# Patient Record
Sex: Male | Born: 1960 | Race: White | Hispanic: No | Marital: Married | Smoking: Never smoker
Health system: Southern US, Community
[De-identification: ages and names within clinical notes are randomized; demographics above are authoritative.]

## PROBLEM LIST (undated history)

## (undated) DIAGNOSIS — G8929 Other chronic pain: Secondary | ICD-10-CM

## (undated) DIAGNOSIS — C6290 Malignant neoplasm of unspecified testis, unspecified whether descended or undescended: Secondary | ICD-10-CM

## (undated) DIAGNOSIS — I10 Essential (primary) hypertension: Secondary | ICD-10-CM

## (undated) DIAGNOSIS — R079 Chest pain, unspecified: Secondary | ICD-10-CM

## (undated) DIAGNOSIS — Z9989 Dependence on other enabling machines and devices: Secondary | ICD-10-CM

## (undated) DIAGNOSIS — K56609 Unspecified intestinal obstruction, unspecified as to partial versus complete obstruction: Secondary | ICD-10-CM

## (undated) DIAGNOSIS — R51 Headache: Secondary | ICD-10-CM

## (undated) DIAGNOSIS — R519 Headache, unspecified: Secondary | ICD-10-CM

## (undated) DIAGNOSIS — G4733 Obstructive sleep apnea (adult) (pediatric): Secondary | ICD-10-CM

## (undated) DIAGNOSIS — Z8249 Family history of ischemic heart disease and other diseases of the circulatory system: Secondary | ICD-10-CM

## (undated) HISTORY — DX: Family history of ischemic heart disease and other diseases of the circulatory system: Z82.49

## (undated) HISTORY — DX: Unspecified intestinal obstruction, unspecified as to partial versus complete obstruction: K56.609

## (undated) HISTORY — PX: APPENDECTOMY: SHX54

## (undated) HISTORY — DX: Essential (primary) hypertension: I10

## (undated) HISTORY — DX: Malignant neoplasm of unspecified testis, unspecified whether descended or undescended: C62.90

## (undated) HISTORY — DX: Chest pain, unspecified: R07.9

---

## 1988-09-01 DIAGNOSIS — C6290 Malignant neoplasm of unspecified testis, unspecified whether descended or undescended: Secondary | ICD-10-CM

## 1988-09-01 DIAGNOSIS — K56609 Unspecified intestinal obstruction, unspecified as to partial versus complete obstruction: Secondary | ICD-10-CM

## 1988-09-01 HISTORY — DX: Unspecified intestinal obstruction, unspecified as to partial versus complete obstruction: K56.609

## 1988-09-01 HISTORY — PX: OTHER SURGICAL HISTORY: SHX169

## 1988-09-01 HISTORY — DX: Malignant neoplasm of unspecified testis, unspecified whether descended or undescended: C62.90

## 1999-11-08 ENCOUNTER — Emergency Department (HOSPITAL_COMMUNITY): Admission: EM | Admit: 1999-11-08 | Discharge: 1999-11-08 | Payer: Self-pay

## 1999-11-08 ENCOUNTER — Encounter: Payer: Self-pay | Admitting: Internal Medicine

## 2001-01-07 ENCOUNTER — Emergency Department (HOSPITAL_COMMUNITY): Admission: EM | Admit: 2001-01-07 | Discharge: 2001-01-07 | Payer: Self-pay | Admitting: Emergency Medicine

## 2002-06-12 ENCOUNTER — Emergency Department (HOSPITAL_COMMUNITY): Admission: EM | Admit: 2002-06-12 | Discharge: 2002-06-12 | Payer: Self-pay | Admitting: *Deleted

## 2002-06-12 ENCOUNTER — Encounter: Payer: Self-pay | Admitting: *Deleted

## 2004-10-22 ENCOUNTER — Ambulatory Visit (HOSPITAL_COMMUNITY): Admission: RE | Admit: 2004-10-22 | Discharge: 2004-10-22 | Payer: Self-pay | Admitting: Urology

## 2009-03-16 ENCOUNTER — Ambulatory Visit: Payer: Self-pay | Admitting: Vascular Surgery

## 2009-03-16 ENCOUNTER — Ambulatory Visit (HOSPITAL_COMMUNITY): Admission: RE | Admit: 2009-03-16 | Discharge: 2009-03-16 | Payer: Self-pay | Admitting: Urology

## 2009-03-16 ENCOUNTER — Encounter (INDEPENDENT_AMBULATORY_CARE_PROVIDER_SITE_OTHER): Payer: Self-pay | Admitting: Urology

## 2009-05-28 ENCOUNTER — Observation Stay (HOSPITAL_COMMUNITY): Admission: EM | Admit: 2009-05-28 | Discharge: 2009-05-28 | Payer: Self-pay | Admitting: Emergency Medicine

## 2009-05-28 ENCOUNTER — Encounter (INDEPENDENT_AMBULATORY_CARE_PROVIDER_SITE_OTHER): Payer: Self-pay | Admitting: Emergency Medicine

## 2009-05-28 ENCOUNTER — Ambulatory Visit: Payer: Self-pay | Admitting: Cardiology

## 2009-06-01 ENCOUNTER — Ambulatory Visit (HOSPITAL_COMMUNITY): Admission: RE | Admit: 2009-06-01 | Discharge: 2009-06-01 | Payer: Self-pay | Admitting: General Surgery

## 2010-12-06 LAB — DIFFERENTIAL
Basophils Absolute: 0 10*3/uL (ref 0.0–0.1)
Eosinophils Absolute: 0.3 10*3/uL (ref 0.0–0.7)
Eosinophils Relative: 5 % (ref 0–5)
Neutrophils Relative %: 61 % (ref 43–77)

## 2010-12-06 LAB — COMPREHENSIVE METABOLIC PANEL
ALT: 43 U/L (ref 0–53)
AST: 29 U/L (ref 0–37)
CO2: 22 mEq/L (ref 19–32)
Chloride: 104 mEq/L (ref 96–112)
Creatinine, Ser: 0.8 mg/dL (ref 0.4–1.5)
GFR calc Af Amer: >60 mL/min (ref >60)
GFR calc non Af Amer: >60 mL/min (ref >60)
Glucose, Bld: 106 mg/dL — ABNORMAL HIGH (ref 70–99)
Sodium: 136 mEq/L (ref 135–145)
Total Bilirubin: 0.9 mg/dL (ref 0.3–1.2)

## 2010-12-06 LAB — CBC
Hemoglobin: 15.8 g/dL (ref 13.0–17.0)
MCV: 89.7 fL (ref 78.0–100.0)
RBC: 5.09 MIL/uL (ref 4.22–5.81)
WBC: 5.2 10*3/uL (ref 4.0–10.5)

## 2010-12-06 LAB — POCT CARDIAC MARKERS
CKMB, poc: 1.2 ng/mL (ref 1.0–8.0)
Myoglobin, poc: 68.7 ng/mL (ref 12–200)
Troponin i, poc: <0.05 ng/mL (ref 0.00–0.09)

## 2010-12-06 LAB — LIPASE, BLOOD: Lipase: 33 U/L (ref 11–59)

## 2013-03-21 ENCOUNTER — Other Ambulatory Visit: Payer: Self-pay | Admitting: Orthopedic Surgery

## 2013-03-21 DIAGNOSIS — M25522 Pain in left elbow: Secondary | ICD-10-CM

## 2013-03-25 ENCOUNTER — Ambulatory Visit
Admission: RE | Admit: 2013-03-25 | Discharge: 2013-03-25 | Disposition: A | Discharge status: Home / Self Care | Payer: 59 | Source: Ambulatory Visit | Attending: Orthopedic Surgery | Admitting: Orthopedic Surgery

## 2013-03-25 DIAGNOSIS — M25522 Pain in left elbow: Secondary | ICD-10-CM

## 2014-02-27 ENCOUNTER — Encounter: Payer: Self-pay | Admitting: Cardiovascular Disease

## 2014-02-27 ENCOUNTER — Ambulatory Visit (INDEPENDENT_AMBULATORY_CARE_PROVIDER_SITE_OTHER): Payer: 59 | Admitting: Cardiovascular Disease

## 2014-02-27 VITALS — BP 112/76 | HR 98 | Ht 70.0 in | Wt 255.0 lb

## 2014-02-27 DIAGNOSIS — I10 Essential (primary) hypertension: Secondary | ICD-10-CM | POA: Insufficient documentation

## 2014-02-27 DIAGNOSIS — Z8249 Family history of ischemic heart disease and other diseases of the circulatory system: Secondary | ICD-10-CM

## 2014-02-27 DIAGNOSIS — R079 Chest pain, unspecified: Secondary | ICD-10-CM

## 2014-02-27 NOTE — Assessment & Plan Note (Signed)
He had recently increasing high blood pressure with addition of 3 antihypertensive medications by his primary care physician currently under better control.

## 2014-02-27 NOTE — Assessment & Plan Note (Signed)
Jimmy Riley is a 53 year old moderately overweight Caucasian male with a history of hypertension and a strong family history of heart disease with both parents with coronary bypass grafting in their 47s. He recently had a prolonged episode of chest pain with radiation to his left arm and diaphoresis one week ago. He said that her current symptoms. 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 Also going to get a lipid and liver profile

## 2014-02-27 NOTE — Patient Instructions (Signed)
  We will see you back in follow up after the test.   Dr Gwenlyn Found has ordered : 1. Exercise Myoview- this is a test that looks at the blood flow to your heart muscle.  It takes approximately 2 1/2 hours. Please follow instruction sheet, as given.  2. Your physician recommends that you return for a FASTING lipid profile: 1-2 weeks

## 2014-02-27 NOTE — Progress Notes (Signed)
02/27/2014 Jimmy Riley   January 30, 1961  [id number]  Primary Physician Florina Ou, MD Primary Cardiologist: Lorretta Harp MD Renae Gloss   HPI:  [name] is a delightful 53 year old moderately overweight married Caucasian male father of 4 children, grandfather and 4 grandchildren referred by Dr. Georgiana Spinner at [facility] clinic for cardiovascular evaluation. His cardiovascular cytopathologist notable for hypertension and a strong family history for heart disease with both parents having had bypass surgery in their early 68s. He has never had a heart attack or stroke. He said recently accelerated hypertension treated pharmacologically by his primary care physician. His past medical history otherwise is remarkable for remote testicular cancer cancer back in 1990. One week ago he developed prolonged episodes of sternal chest pain going to his left shoulder associated with diaphoresis lasting for 30 minutes. He has had no subsequent episodes.   Current Outpatient Prescriptions  Medication Sig Dispense Refill  . amLODipine (NORVASC) 10 MG tablet Take 10 mg by mouth daily.      Marland Kitchen aspirin 81 MG tablet Take 81 mg by mouth daily.      Marland Kitchen lisinopril-hydrochlorothiazide (PRINZIDE,ZESTORETIC) 20-12.5 MG per tablet Take 1 tablet by mouth 2 (two) times daily.       No current facility-administered medications for this visit.    Allergies  Allergen Reactions  . Tetracyclines & Related Itching    Swelling in mouth, lips    History   Social History  . Marital Status: Married    Spouse Name: N/A    Number of Children: N/A  . Years of Education: N/A   Occupational History  . Not on file.   Social History Main Topics  . Smoking status: Never Smoker   . Smokeless tobacco: Not on file  . Alcohol Use: Not on file  . Drug Use: Not on file  . Sexual Activity: Not on file   Other Topics Concern  . Not on file   Social History Narrative  . No narrative on file      Review of Systems: General: negative for chills, fever, night sweats or weight changes.  Cardiovascular: negative for chest pain, dyspnea on exertion, edema, orthopnea, palpitations, paroxysmal nocturnal dyspnea or shortness of breath Dermatological: negative for rash Respiratory: negative for cough or wheezing Urologic: negative for hematuria Abdominal: negative for nausea, vomiting, diarrhea, bright red blood per rectum, melena, or hematemesis Neurologic: negative for visual changes, syncope, or dizziness All other systems reviewed and are otherwise negative except as noted above.    Blood pressure 112/76, pulse 98, height 5\' 10"  (1.778 m), weight 255 lb (115.667 kg).  General appearance: alert and no distress Neck: no adenopathy, no carotid bruit, no JVD, supple, symmetrical, trachea midline and thyroid not enlarged, symmetric, no tenderness/mass/nodules Lungs: clear to auscultation bilaterally Heart: regular rate and rhythm, S1, S2 normal, no murmur, click, rub or gallop Extremities: extremities normal, atraumatic, no cyanosis or edema  EKG normal sinus rhythm at 98 without ST or T wave changes  ASSESSMENT AND PLAN:   Essential hypertension He had recently increasing high blood pressure with addition of 3 antihypertensive medications by his primary care physician currently under better control.  Chest pain [name] is a 53 year old moderately overweight Caucasian male with a history of hypertension and a strong family history of heart disease with both parents with coronary bypass grafting in their 68s. He recently had a prolonged episode of chest pain with radiation to his left arm and diaphoresis one week ago. He  said that her current symptoms. 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 Also going to get a lipid and liver profile      Lorretta Harp MD [facility],  [facility] 02/27/2014 5:02 PM

## 2014-03-06 ENCOUNTER — Telehealth (HOSPITAL_COMMUNITY): Payer: Self-pay | Admitting: *Deleted

## 2014-03-13 ENCOUNTER — Encounter: Payer: Self-pay | Admitting: Neurology

## 2014-03-13 ENCOUNTER — Encounter (HOSPITAL_COMMUNITY): Payer: Self-pay | Admitting: *Deleted

## 2014-03-13 ENCOUNTER — Ambulatory Visit (INDEPENDENT_AMBULATORY_CARE_PROVIDER_SITE_OTHER): Payer: 59 | Admitting: Neurology

## 2014-03-13 VITALS — BP 104/73 | HR 101 | Temp 98.5°F | Ht 70.0 in | Wt 250.0 lb

## 2014-03-13 DIAGNOSIS — R0989 Other specified symptoms and signs involving the circulatory and respiratory systems: Secondary | ICD-10-CM

## 2014-03-13 DIAGNOSIS — R519 Headache, unspecified: Secondary | ICD-10-CM

## 2014-03-13 DIAGNOSIS — R0683 Snoring: Secondary | ICD-10-CM

## 2014-03-13 DIAGNOSIS — G478 Other sleep disorders: Secondary | ICD-10-CM

## 2014-03-13 DIAGNOSIS — I10 Essential (primary) hypertension: Secondary | ICD-10-CM

## 2014-03-13 DIAGNOSIS — R51 Headache: Secondary | ICD-10-CM

## 2014-03-13 DIAGNOSIS — R0609 Other forms of dyspnea: Secondary | ICD-10-CM

## 2014-03-13 NOTE — Progress Notes (Signed)
Subjective:    Patient ID: Jimmy Riley is a 53 y.o. male.  HPI    Star Age, MD, PhD [facility] [address], [address] [place], [place] [address]    Dear Dr. Domingo Cocking,   I saw your patient, Jimmy Riley, upon your kind request in my neurologic clinic today for initial consultation of his recurrent headaches. The patient is unaccompanied today. As you know, Jimmy Riley is a 53 year old right-handed gentleman with a mediical history of hypertension, chest pain, and testicular cancer (in 1990 with SBO x 2), who has noted a severe primarily occipital headache for the past month or so. This is associated with a throbbing sensation and recurrent radiating neck pain on the R. Resting in a darkened room and taking Tylenol has helped some. He has no prior history of cluster headaches. He has no significant nausea, vomiting, sonophobia, photophobia, rhinorrhea, or excess lacrimation or nasal discharge. His headache started some 5 weeks ago and went on for a few weeks. He was noted to have an elevated BP, which has been treated first with lisinopril once daily, then twice daily, then with additional amplodipine. On 02/15/14 he was on a flight to [facility] for a training and had CP, with diaphoresis, which lasted some minutes. He has since then seen cardiology and has a stress test scheduled with Dr. Gwenlyn Found next week. He snores and is not sure if there are apneas. He has woken up with a HA. He does recall that when he sleeps in the recliner he often wakes up with a sense of strangling. He had a CTH some 2 weeks ago, which per his report was normal. He had some improved HAs since the BP came down. He has a family history of stroke in his father and PGF and heart disease on both sides of the family.  Never had TIA or stroke symptoms, denying sudden onset of one sided weakness, numbness, tingling, slurring of speech or droopy face, hearing loss, tinnitus, diplopia or  visual field cut or monocular loss of vision.  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 He does not sleep well.   His Past Medical History Is Significant For: Past Medical History  Diagnosis Date  . HTN (hypertension)   . Testicular cancer 1990  . Bowel obstruction 1990  . Chest pain   . Family history of heart disease     His Past Surgical History Is Significant For: Past Surgical History  Procedure Laterality Date  . Bowel surgery  1990    obstruction    His Family History Is Significant For: Family History  Problem Relation Age of Onset  . Diabetes Mother   . Hypertension Mother   . Cancer Mother   . Heart disease Mother     bypass in 27s  . Heart attack Maternal Grandfather     massive MI at age 69  . Hypertension Father   . Stroke Father   . Heart disease Father     bypass in 25s    His Social History Is Significant For: History   Social History  . Marital Status: Married    Spouse Name: N/A    Number of Children: N/A  . Years of Education: N/A   Social History Main Topics  . Smoking status: Never Smoker   . Smokeless tobacco: None  . Alcohol Use: None  . Drug Use: None  . Sexual Activity: None   Other Topics Concern  .  None   Social History Narrative  . None    His Allergies Are:  Allergies  Allergen Reactions  . Tetracyclines & Related Itching    Swelling in mouth, lips  :   His Current Medications Are:  Outpatient Encounter Prescriptions as of 03/13/2014  Medication Sig  . amLODipine (NORVASC) 10 MG tablet Take 10 mg by mouth daily.  Marland Kitchen aspirin 81 MG tablet Take 81 mg by mouth daily.  Marland Kitchen lisinopril-hydrochlorothiazide (PRINZIDE,ZESTORETIC) 20-12.5 MG per tablet Take 1 tablet by mouth 2 (two) times daily.  :  Review of Systems:  Out of a complete 14 point review of systems, all are reviewed and negative with the exception of these symptoms as listed below:  Review of Systems  Constitutional: Positive for fatigue.  Eyes:  Negative.   Respiratory: Positive for cough.   Cardiovascular: Negative.   Gastrointestinal: Negative.   Endocrine: Negative.   Genitourinary: Negative.   Musculoskeletal: Negative.   Allergic/Immunologic: Negative.   Neurological: Positive for weakness, numbness and headaches.  Hematological: Negative.   Psychiatric/Behavioral: Negative.     Objective:  Neurologic Exam  Physical Exam Physical Examination:   Filed Vitals:   03/13/14 1336  BP: 104/73  Pulse: 101  Temp:     General Examination: The patient is a very pleasant 53 y.o. male in no acute distress. He appears well-developed and well-nourished and well groomed. He is overweight.  HEENT: Normocephalic, atraumatic, pupils are equal, round and reactive to light and accommodation. Funduscopic exam is normal with sharp disc margins noted. Extraocular tracking is good without limitation to gaze excursion or nystagmus noted. Normal smooth pursuit is noted. Hearing is grossly intact. Tympanic membranes are clear bilaterally. Face is symmetric with normal facial animation and normal facial sensation. Speech is clear with no dysarthria noted. There is no hypophonia. There is no lip, neck/head, jaw or voice tremor. Neck is supple with full range of passive and active motion. There are no carotid bruits on auscultation. Oropharynx exam reveals: moderate mouth dryness, adequate dental hygiene and moderate airway crowding, due to redundant soft palate, larger uvula, and larger tongue. Mallampati is class II. Tongue protrudes centrally and palate elevates symmetrically. Tonsils are small in size. Neck size is 17 inches. He has a mild overbite.  Chest: Clear to auscultation without wheezing, rhonchi or crackles noted.  Heart: S1+S2+0, regular and normal without murmurs, rubs or gallops noted.   Abdomen: Soft, non-tender and non-distended with normal bowel sounds appreciated on auscultation.  Extremities: There is no pitting edema in the  distal lower extremities bilaterally. Pedal pulses are intact.  Skin: Warm and dry without trophic changes noted. There are no varicose veins.  Musculoskeletal: exam reveals no obvious joint deformities, tenderness or joint swelling or erythema.   Neurologically:  Mental status: The patient is awake, alert and oriented in all 4 spheres. His immediate and remote memory, attention, language skills and fund of knowledge are appropriate. There is no evidence of aphasia, agnosia, apraxia or anomia. Speech is clear with normal prosody and enunciation. Thought process is linear. Mood is normal and affect is normal.  Cranial nerves II - XII are as described above under HEENT exam. In addition: shoulder shrug is normal with equal shoulder height noted. Motor exam: Normal bulk, strength and tone is noted. There is no drift, tremor or rebound. Romberg is negative. Reflexes are 2+ throughout. Babinski: Toes are flexor bilaterally. Fine motor skills and coordination: intact with normal finger taps, normal hand movements, normal rapid alternating patting,  normal foot taps and normal foot agility.  Cerebellar testing: No dysmetria or intention tremor on finger to nose testing. Heel to shin is unremarkable bilaterally. There is no truncal or gait ataxia.  Sensory exam: intact to light touch, pinprick, vibration, temperature sense in the upper and lower extremities. He has some subjective numbness in the L sciatic area, not new.  Gait, station and balance: He stands easily. No veering to one side is noted. No leaning to one side is noted. Posture is age-appropriate and stance is narrow based. Gait shows normal stride length and normal pace. No problems turning are noted. He turns en bloc. Tandem walk is unremarkable. Intact toe and heel stance is noted.               Assessment and Plan:   In summary, Jimmy Riley is a very pleasant 53 y.o.-year old male with an underlying medical history of testicular cancer,  who recent started having recurrent severe headaches in the occipital area on the right with radiation to his neck. This was in conjunction with a relatively new diagnosis of significant hypertension which since then has been treated with 3 different medications and his headaches have improved. He had one episode of radiating chest pain with associated diaphoresis and is currently pursuing workup for this with cardiology. I do think his headaches are primarily linked to blood pressure elevation. I would like to proceed with a brain MRI for completion. Furthermore I would like to get the test results of his CT head. I also talked to him about doing a sleep study. Based on his tighter looking airway, his weight gain, the fact that he snores and his subjective feeling of waking up with a sense of choking, I think there may be underlying OSA which may be a contributor to recurrent headaches and elevations in blood pressure values. He would be agreeable to coming back for sleep study and also for his brain MRI. I would like to see him back afterwards. We mutually agreed that he does not currently require treatment symptomatically for his headaches. I reassured him that his neurological exam is nonfocal. We talked about headache triggers. We also talked about maintaining a healthy lifestyle and sleep apnea and its prognosis and treatment options and the link between untreated moderate to severe OSA and cardiovascular disease.   I answered all his questions today and the patient was in agreement with the above outlined plan. I would like to see the patient back after his tests are done, or sooner if the need arises and encouraged him to call with any interim questions, concerns, problems or updates.   Thank you very much for allowing me to participate in the care of this nice patient. If I can be of any further assistance to you please do not hesitate to call me at [phone].  Sincerely,   Star Age, MD,  PhD

## 2014-03-13 NOTE — Patient Instructions (Signed)
I think you have had headaches primarily due to your higher BP.  Let's do a brain MRI and sleep study.   Based on your symptoms and your exam I believe you are at risk for obstructive sleep apnea or OSA, and I think we should proceed with a sleep study to determine whether you do or do not have OSA and how severe it is. If you have more than mild OSA, I want you to consider treatment with CPAP. Please remember, the risks and ramifications of moderate to severe obstructive sleep apnea or OSA are: Cardiovascular disease, including congestive heart failure, stroke, difficult to control hypertension, arrhythmias, and even type 2 diabetes has been linked to untreated OSA. Sleep apnea causes disruption of sleep and sleep deprivation in most cases, which, in turn, can cause recurrent headaches, problems with memory, mood, concentration, focus, and vigilance. Most people with untreated sleep apnea report excessive daytime sleepiness, which can affect their ability to drive. Please do not drive if you feel sleepy.  I will see you back after your sleep study to go over the test results and where to go from there. We will call you after your sleep study and to set up an appointment at the time.   Please remember, common headache triggers are: sleep deprivation, dehydration, overheating, stress, hypoglycemia or skipping meals and blood sugar fluctuations, excessive pain medications or excessive alcohol use or caffeine withdrawal. Some people have food triggers such as aged cheese, orange juice or chocolate, especially dark chocolate, or MSG (monosodium glutamate). Try to avoid these headache triggers as much possible. It may be helpful to keep a headache diary to figure out what makes your headaches worse or brings them on and what alleviates them. Some people report headache onset after exercise but studies have shown that regular exercise may actually prevent headaches from coming. If you have exercise-induced  headaches, please make sure that you drink plenty of fluid before and after exercising and that you do not over do it and do not overheat.

## 2014-03-16 ENCOUNTER — Telehealth (HOSPITAL_COMMUNITY): Payer: Self-pay

## 2014-03-16 ENCOUNTER — Other Ambulatory Visit: Payer: 59

## 2014-03-16 NOTE — Telephone Encounter (Signed)
Encounter complete. 

## 2014-03-17 ENCOUNTER — Telehealth (HOSPITAL_COMMUNITY): Payer: Self-pay

## 2014-03-17 NOTE — Telephone Encounter (Signed)
Encounter complete. 

## 2014-03-21 ENCOUNTER — Encounter (HOSPITAL_COMMUNITY): Payer: 59

## 2014-03-21 ENCOUNTER — Telehealth (HOSPITAL_COMMUNITY): Payer: Self-pay

## 2014-03-21 NOTE — Telephone Encounter (Signed)
Encounter complete. 

## 2014-03-22 ENCOUNTER — Telehealth (HOSPITAL_COMMUNITY): Payer: Self-pay

## 2014-03-22 ENCOUNTER — Ambulatory Visit (HOSPITAL_COMMUNITY)
Admission: RE | Admit: 2014-03-22 | Discharge: 2014-03-22 | Disposition: A | Discharge status: Home / Self Care | Payer: 59 | Source: Ambulatory Visit | Attending: Cardiovascular Disease | Admitting: Cardiovascular Disease

## 2014-03-22 DIAGNOSIS — R079 Chest pain, unspecified: Secondary | ICD-10-CM | POA: Insufficient documentation

## 2014-03-22 DIAGNOSIS — Z8249 Family history of ischemic heart disease and other diseases of the circulatory system: Secondary | ICD-10-CM | POA: Insufficient documentation

## 2014-03-22 MED ORDER — TECHNETIUM TC 99M SESTAMIBI GENERIC - CARDIOLITE
10.4000 | Freq: Once | INTRAVENOUS | Status: AC | PRN
  Administered 2014-03-22: 10 via INTRAVENOUS

## 2014-03-22 MED ORDER — TECHNETIUM TC 99M SESTAMIBI GENERIC - CARDIOLITE
30.4000 | Freq: Once | INTRAVENOUS | Status: AC | PRN
  Administered 2014-03-22: 30 via INTRAVENOUS

## 2014-03-22 NOTE — Procedures (Addendum)
[facility] [address] [place] [address] [phone]  Cardiology Nuclear Med Study  Jimmy Riley is a 53 y.o. male     MRN : [id number]     DOB: September 15, 1960  Procedure Date: 03/22/2014  Nuclear Med Background Indication for Stress Test:  Evaluation for Ischemia History:  No prior respiratory or cardiac history reported;No prior NUC MPI for comparison; Cardiac Risk Factors: Family History - CAD, Hypertension and Overweight  Symptoms:  Chest Pain, Diaphoresis, DOE, Fatigue, Light-Headedness and weakness;cough;left arm pain   Nuclear Pre-Procedure Caffeine/Decaff Intake:  1:00am NPO After: 11am   IV Site: R Forearm  IV 0.9% NS with Angio Cath:  22g  Chest Size (in):  50"  IV Started by: Rolene Course, RN  Height: 5\' 10"  (1.778 m)  Cup Size: n/a  BMI:  Body mass index is 36.59 kg/(m^2). Weight:  255 lb (115.667 kg)   Tech Comments:  n/a    Nuclear Med Study 1 or 2 day study: 1 day  Stress Test Type:  Stress  Order Authorizing Provider:  Quay Burow, MD   Resting Radionuclide: Technetium 52m Sestamibi  Resting Radionuclide Dose: 10.4 mCi   Stress Radionuclide:  Technetium 19m Sestamibi  Stress Radionuclide Dose: 30.4 mCi           Stress Protocol Rest HR: 93 Stress HR: 160  Rest BP: 110/76 Stress BP: 147/73  Exercise Time (min): 9:32 METS: 10.90   Predicted Max HR: 168 bpm % Max HR: 95.24 bpm Rate Pressure Product: 23520  Dose of Adenosine (mg):  n/a Dose of Lexiscan: n/a mg  Dose of Atropine (mg): n/a Dose of Dobutamine: n/a mcg/kg/min (at max HR)  Stress Test Technologist: Mellody Memos, CCT Nuclear Technologist: Imagene Riches, CNMT   Rest Procedure:  Myocardial perfusion imaging was performed at rest 45 minutes following the intravenous administration of Technetium 63m Sestamibi. Stress Procedure:  The patient performed treadmill exercise using a Bruce  Protocol for 9 minutes 32 seconds. The patient  stopped due to generalized fatigue. Patient denied any chest pain.  There were no significant ST-T wave changes.  Technetium 85m Sestamibi was injected IV at peak exercise and myocardial perfusion imaging was performed after a brief delay.  Transient Ischemic Dilatation (Normal <1.22):  1.03  QGS EDV:  66 ml QGS ESV:  24 ml LV Ejection Fraction: 64%        Rest ECG: NSR - Normal EKG  Stress ECG: No significant change from baseline ECG  QPS Raw Data Images:  Normal; no motion artifact; normal heart/lung ratio. Stress Images:  Normal homogeneous uptake in all areas of the myocardium. Rest Images:  Normal homogeneous uptake in all areas of the myocardium. Subtraction (SDS):  No evidence of ischemia.  Impression Exercise Capacity:  Excellent exercise capacity. BP Response:  Normal blood pressure response. Clinical Symptoms:  No significant symptoms noted. ECG Impression:  No significant ST segment change suggestive of ischemia. Comparison with Prior Nuclear Study: No images to compare  Overall Impression:  Normal stress nuclear study.  LV Wall Motion:  NL LV Function; NL Wall Motion   Lorretta Harp, MD  03/22/2014 5:10 PM

## 2014-03-22 NOTE — Telephone Encounter (Signed)
Encounter complete. 

## 2014-03-23 ENCOUNTER — Telehealth: Payer: Self-pay | Admitting: *Deleted

## 2014-03-23 NOTE — Telephone Encounter (Signed)
Spoke to patient. Result given . Verbalized understanding  

## 2014-03-23 NOTE — Telephone Encounter (Signed)
Message copied by Raiford Simmonds on Thu Mar 23, 2014  6:13 PM ------      Message from: Lorretta Harp      Created: Thu Mar 23, 2014  1:50 PM       Call and tell normal ------

## 2014-04-25 ENCOUNTER — Ambulatory Visit (INDEPENDENT_AMBULATORY_CARE_PROVIDER_SITE_OTHER): Payer: 59 | Admitting: Neurology

## 2014-04-25 ENCOUNTER — Encounter: Payer: Self-pay | Admitting: Neurology

## 2014-04-25 DIAGNOSIS — G4761 Periodic limb movement disorder: Secondary | ICD-10-CM

## 2014-04-25 DIAGNOSIS — R51 Headache: Principal | ICD-10-CM

## 2014-04-25 DIAGNOSIS — R519 Headache, unspecified: Secondary | ICD-10-CM

## 2014-04-25 DIAGNOSIS — G4733 Obstructive sleep apnea (adult) (pediatric): Secondary | ICD-10-CM

## 2014-04-25 DIAGNOSIS — R0683 Snoring: Secondary | ICD-10-CM

## 2014-04-25 DIAGNOSIS — I10 Essential (primary) hypertension: Secondary | ICD-10-CM

## 2014-04-25 DIAGNOSIS — G478 Other sleep disorders: Secondary | ICD-10-CM

## 2014-05-10 ENCOUNTER — Telehealth: Payer: Self-pay | Admitting: Neurology

## 2014-05-10 DIAGNOSIS — G4733 Obstructive sleep apnea (adult) (pediatric): Secondary | ICD-10-CM

## 2014-05-10 NOTE — Telephone Encounter (Signed)
Please call and notify patient that the recent sleep study confirmed the diagnosis of OSA. He did well with CPAP during the study with significant improvement of the respiratory events. Therefore, I would like start the patient on CPAP at home. I placed the order in the chart.   Arrange for CPAP set up at home through a DME company of patient's choice and fax/route report to PCP and referring MD (if other than PCP).   The patient will also need a follow up appointment with me in 6-8 weeks post set up that has to be scheduled; help the patient schedule this (in a follow-up slot).   Please re-enforce the importance of compliance with treatment and the need for us to monitor compliance data.   Once you have spoken to the patient and scheduled the return appointment, you may close this encounter, thanks,   Weyman Bogdon, MD, PhD [facility]    

## 2014-05-15 NOTE — Telephone Encounter (Signed)
Called and provided results of patient's split night study performed on 04/25/2014.  Patient was informed that the use of CPAP had been ordered and he would be set up with a DME company in this case [facility].  The patient was informed that a copy of the test would be mailed to him and a copy would be provided to the referring physician; Dr. Richrd Prime.

## 2014-05-16 ENCOUNTER — Encounter: Payer: Self-pay | Admitting: Neurology

## 2014-08-01 ENCOUNTER — Encounter: Payer: Self-pay | Admitting: Neurology

## 2014-10-02 NOTE — Progress Notes (Signed)
Quick Note:  I reviewed the patient's CPAP compliance data from 06/30/2014 through 07/29/2014 which is a total of 30 days but he did not use his machine, except for 2 days with an average usage of one hour and 4 minutes for those 2 days. He is not using his machine and is not compliant. Please contact his DME company for further information about what his compliance obstacles may be and also get in touch with patient regarding his noncompliance. Please encourage him to make a follow-up appointment with me.  Star Age, MD, PhD [facility]  ______

## 2014-11-24 ENCOUNTER — Encounter (HOSPITAL_COMMUNITY): Payer: Self-pay | Admitting: Emergency Medicine

## 2014-11-24 ENCOUNTER — Encounter (HOSPITAL_COMMUNITY)
Admission: EM | Disposition: A | Discharge status: Home / Self Care | Payer: Self-pay | Source: Home / Self Care | Attending: Internal Medicine

## 2014-11-24 ENCOUNTER — Emergency Department (HOSPITAL_COMMUNITY): Payer: 59

## 2014-11-24 ENCOUNTER — Inpatient Hospital Stay (HOSPITAL_COMMUNITY)
Admission: EM | Admit: 2014-11-24 | Discharge: 2014-11-25 | DRG: 287 | Disposition: A | Discharge status: Home / Self Care | Payer: 59 | Attending: Internal Medicine | Admitting: Internal Medicine

## 2014-11-24 DIAGNOSIS — I10 Essential (primary) hypertension: Secondary | ICD-10-CM | POA: Diagnosis present

## 2014-11-24 DIAGNOSIS — I200 Unstable angina: Secondary | ICD-10-CM | POA: Diagnosis present

## 2014-11-24 DIAGNOSIS — Z888 Allergy status to other drugs, medicaments and biological substances status: Secondary | ICD-10-CM

## 2014-11-24 DIAGNOSIS — G4733 Obstructive sleep apnea (adult) (pediatric): Secondary | ICD-10-CM | POA: Diagnosis present

## 2014-11-24 DIAGNOSIS — Z8547 Personal history of malignant neoplasm of testis: Secondary | ICD-10-CM | POA: Diagnosis not present

## 2014-11-24 DIAGNOSIS — Q245 Malformation of coronary vessels: Secondary | ICD-10-CM | POA: Diagnosis not present

## 2014-11-24 DIAGNOSIS — R51 Headache: Secondary | ICD-10-CM | POA: Diagnosis present

## 2014-11-24 DIAGNOSIS — Z6835 Body mass index (BMI) 35.0-35.9, adult: Secondary | ICD-10-CM | POA: Diagnosis not present

## 2014-11-24 DIAGNOSIS — R079 Chest pain, unspecified: Secondary | ICD-10-CM | POA: Diagnosis present

## 2014-11-24 DIAGNOSIS — Z7982 Long term (current) use of aspirin: Secondary | ICD-10-CM

## 2014-11-24 DIAGNOSIS — R0902 Hypoxemia: Secondary | ICD-10-CM | POA: Diagnosis present

## 2014-11-24 DIAGNOSIS — R0600 Dyspnea, unspecified: Secondary | ICD-10-CM | POA: Diagnosis not present

## 2014-11-24 DIAGNOSIS — R0602 Shortness of breath: Secondary | ICD-10-CM | POA: Diagnosis not present

## 2014-11-24 HISTORY — DX: Obstructive sleep apnea (adult) (pediatric): G47.33

## 2014-11-24 HISTORY — DX: Headache: R51

## 2014-11-24 HISTORY — PX: LEFT HEART CATHETERIZATION WITH CORONARY ANGIOGRAM: SHX5451

## 2014-11-24 HISTORY — DX: Dependence on other enabling machines and devices: Z99.89

## 2014-11-24 HISTORY — DX: Headache, unspecified: R51.9

## 2014-11-24 HISTORY — DX: Other chronic pain: G89.29

## 2014-11-24 LAB — I-STAT CHEM 8, ED
BUN: 14 mg/dL (ref 6–23)
CALCIUM ION: 1.14 mmol/L (ref 1.12–1.23)
Chloride: 103 mmol/L (ref 96–112)
Creatinine, Ser: 0.8 mg/dL (ref 0.50–1.35)
GLUCOSE: 129 mg/dL — AB (ref 70–99)
HEMATOCRIT: 42 % (ref 39.0–52.0)
HEMOGLOBIN: 14.3 g/dL (ref 13.0–17.0)
POTASSIUM: 3.5 mmol/L (ref 3.5–5.1)
Sodium: 140 mmol/L (ref 135–145)
TCO2: 20 mmol/L (ref 0–100)

## 2014-11-24 LAB — CBC WITH DIFFERENTIAL/PLATELET
BASOS ABS: 0 10*3/uL (ref 0.0–0.1)
BASOS PCT: 1 % (ref 0–1)
EOS ABS: 0.4 10*3/uL (ref 0.0–0.7)
EOS PCT: 8 % — AB (ref 0–5)
HCT: 42.4 % (ref 39.0–52.0)
Hemoglobin: 14.4 g/dL (ref 13.0–17.0)
LYMPHS PCT: 25 % (ref 12–46)
Lymphs Abs: 1.2 10*3/uL (ref 0.7–4.0)
MCH: 29.9 pg (ref 26.0–34.0)
MCHC: 34 g/dL (ref 30.0–36.0)
MCV: 88.1 fL (ref 78.0–100.0)
Monocytes Absolute: 0.6 10*3/uL (ref 0.1–1.0)
Monocytes Relative: 12 % (ref 3–12)
Neutro Abs: 2.6 10*3/uL (ref 1.7–7.7)
Neutrophils Relative %: 54 % (ref 43–77)
PLATELETS: 214 10*3/uL (ref 150–400)
RBC: 4.81 MIL/uL (ref 4.22–5.81)
RDW: 12.5 % (ref 11.5–15.5)
WBC: 4.8 10*3/uL (ref 4.0–10.5)

## 2014-11-24 LAB — TSH: TSH: 1.608 u[IU]/mL (ref 0.350–4.500)

## 2014-11-24 LAB — TROPONIN I: Troponin I: <0.03 ng/mL (ref <0.031)

## 2014-11-24 LAB — D-DIMER, QUANTITATIVE (NOT AT ARMC): D DIMER QUANT: 0.32 ug{FEU}/mL (ref 0.00–0.48)

## 2014-11-24 LAB — I-STAT TROPONIN, ED: Troponin i, poc: 0.01 ng/mL (ref 0.00–0.08)

## 2014-11-24 SURGERY — LEFT HEART CATHETERIZATION WITH CORONARY ANGIOGRAM
Anesthesia: LOCAL

## 2014-11-24 MED ORDER — ASPIRIN 81 MG PO CHEW
243.0000 mg | CHEWABLE_TABLET | Freq: Once | ORAL | Status: AC
  Administered 2014-11-24: 243 mg via ORAL
  Filled 2014-11-24: qty 3

## 2014-11-24 MED ORDER — FENTANYL CITRATE 0.05 MG/ML IJ SOLN
INTRAMUSCULAR | Status: AC
  Filled 2014-11-24: qty 2

## 2014-11-24 MED ORDER — ONDANSETRON HCL 4 MG/2ML IJ SOLN: 4.0000 mg | Freq: Four times a day (QID) | INTRAMUSCULAR | Status: DC | PRN

## 2014-11-24 MED ORDER — NITROGLYCERIN 2 % TD OINT
0.5000 [in_us] | TOPICAL_OINTMENT | Freq: Four times a day (QID) | TRANSDERMAL | Status: DC
  Administered 2014-11-24 – 2014-11-25 (×2): 0.5 [in_us] via TOPICAL
  Filled 2014-11-24: qty 30

## 2014-11-24 MED ORDER — LOSARTAN POTASSIUM 25 MG PO TABS
25.0000 mg | ORAL_TABLET | Freq: Every day | ORAL | Status: DC
  Filled 2014-11-24: qty 1

## 2014-11-24 MED ORDER — SODIUM CHLORIDE 0.9 % IV SOLN: 1.0000 mL/kg/h | INTRAVENOUS | Status: AC

## 2014-11-24 MED ORDER — METOPROLOL TARTRATE 12.5 MG HALF TABLET
12.5000 mg | ORAL_TABLET | Freq: Two times a day (BID) | ORAL | Status: DC
  Administered 2014-11-24: 12.5 mg via ORAL
  Filled 2014-11-24: qty 1

## 2014-11-24 MED ORDER — SULFAMETHOXAZOLE-TRIMETHOPRIM 800-160 MG PO TABS
2.0000 | ORAL_TABLET | Freq: Two times a day (BID) | ORAL | Status: DC
  Administered 2014-11-24: 19:00:00 2 via ORAL
  Filled 2014-11-24 (×3): qty 2

## 2014-11-24 MED ORDER — HEPARIN BOLUS VIA INFUSION
4000.0000 [IU] | Freq: Once | INTRAVENOUS | Status: AC
Start: 2014-11-24 — End: 2014-11-24
  Administered 2014-11-24: 4000 [IU] via INTRAVENOUS
  Filled 2014-11-24: qty 4000

## 2014-11-24 MED ORDER — ACETAMINOPHEN 325 MG PO TABS: 650.0000 mg | ORAL_TABLET | ORAL | Status: DC | PRN

## 2014-11-24 MED ORDER — HEPARIN (PORCINE) IN NACL 100-0.45 UNIT/ML-% IJ SOLN
1300.0000 [IU]/h | INTRAMUSCULAR | Status: DC
  Administered 2014-11-24: 1300 [IU]/h via INTRAVENOUS
  Filled 2014-11-24: qty 250

## 2014-11-24 MED ORDER — HYDROCHLOROTHIAZIDE 25 MG PO TABS
25.0000 mg | ORAL_TABLET | Freq: Every day | ORAL | Status: DC
  Administered 2014-11-24: 25 mg via ORAL
  Filled 2014-11-24 (×2): qty 1

## 2014-11-24 MED ORDER — NITROGLYCERIN 0.4 MG SL SUBL: 0.4000 mg | SUBLINGUAL_TABLET | SUBLINGUAL | Status: DC | PRN

## 2014-11-24 MED ORDER — LIDOCAINE HCL (PF) 1 % IJ SOLN
INTRAMUSCULAR | Status: AC
  Filled 2014-11-24: qty 30

## 2014-11-24 MED ORDER — ATORVASTATIN CALCIUM 80 MG PO TABS
80.0000 mg | ORAL_TABLET | Freq: Every day | ORAL | Status: DC
  Administered 2014-11-24: 19:00:00 80 mg via ORAL
  Filled 2014-11-24 (×2): qty 1

## 2014-11-24 MED ORDER — NITROGLYCERIN 1 MG/10 ML FOR IR/CATH LAB
INTRA_ARTERIAL | Status: AC
  Filled 2014-11-24: qty 10

## 2014-11-24 MED ORDER — ACETAMINOPHEN 325 MG PO TABS
650.0000 mg | ORAL_TABLET | ORAL | Status: DC | PRN
  Administered 2014-11-24: 650 mg via ORAL
  Filled 2014-11-24: qty 2

## 2014-11-24 MED ORDER — ASPIRIN EC 81 MG PO TBEC
81.0000 mg | DELAYED_RELEASE_TABLET | Freq: Every day | ORAL | Status: DC
  Filled 2014-11-24: qty 1

## 2014-11-24 MED ORDER — AMLODIPINE BESYLATE 10 MG PO TABS
10.0000 mg | ORAL_TABLET | Freq: Every day | ORAL | Status: DC
  Filled 2014-11-24 (×2): qty 1

## 2014-11-24 MED ORDER — NITROGLYCERIN 0.4 MG SL SUBL
0.4000 mg | SUBLINGUAL_TABLET | SUBLINGUAL | Status: AC | PRN
Start: 2014-11-24 — End: 2014-11-24
  Administered 2014-11-24 (×3): 0.4 mg via SUBLINGUAL
  Filled 2014-11-24: qty 1

## 2014-11-24 MED ORDER — MIDAZOLAM HCL 2 MG/2ML IJ SOLN
INTRAMUSCULAR | Status: AC
  Filled 2014-11-24: qty 2

## 2014-11-24 MED ORDER — MORPHINE SULFATE 4 MG/ML IJ SOLN
4.0000 mg | INTRAMUSCULAR | Status: DC | PRN
  Administered 2014-11-24: 4 mg via INTRAVENOUS
  Filled 2014-11-24: qty 1

## 2014-11-24 MED ORDER — VERAPAMIL HCL 2.5 MG/ML IV SOLN
INTRAVENOUS | Status: AC
  Filled 2014-11-24: qty 2

## 2014-11-24 MED ORDER — SODIUM CHLORIDE 0.9 % IV SOLN: INTRAVENOUS | Status: DC

## 2014-11-24 MED ORDER — HEPARIN (PORCINE) IN NACL 2-0.9 UNIT/ML-% IJ SOLN
INTRAMUSCULAR | Status: AC
  Filled 2014-11-24: qty 1500

## 2014-11-24 MED ORDER — HEPARIN SODIUM (PORCINE) 1000 UNIT/ML IJ SOLN
INTRAMUSCULAR | Status: AC
  Filled 2014-11-24: qty 1

## 2014-11-24 NOTE — Progress Notes (Signed)
TR BAND REMOVAL  LOCATION:    right radial  DEFLATED PER PROTOCOL:    Yes.    TIME BAND OFF / DRESSING APPLIED:    1930   SITE UPON ARRIVAL:    Level 0  SITE AFTER BAND REMOVAL:    Level 0  REVERSE ALLEN'S TEST:     positive  CIRCULATION SENSATION AND MOVEMENT:    Within Normal Limits   Yes.    COMMENTS:

## 2014-11-24 NOTE — H&P (Signed)
Patient ID: Jimmy Riley MRN: [id number], DOB/AGE: August 19, 1961   Admit date: 11/24/2014  Primary Physician: Florina Ou, MD Primary Cardiologist: Adora Fridge, MD   Pt. Profile: A 54 year old male with a hx of hypertension, OSA (on CPAP) and strong family hx of heart disease with both parents with coronary bypass grafting in their 103's who presents today w/ c/p.  Problem List  Past Medical History  Diagnosis Date  . HTN (hypertension)   . Testicular cancer 1990  . Bowel obstruction 1990  . Chest pain     a. 03/2014 - Myoview stress - normal nuclear study and normal LV function  . Family history of heart disease   . Chronic headache   . OSA on CPAP     Past Surgical History  Procedure Laterality Date  . Bowel surgery  1990    obstruction    Allergies  Allergies  Allergen Reactions  . Tetracyclines & Related Itching    Swelling in mouth, lips   HPI  55 y/o male with the above problem list.  He does have a FH of premature CAD and a h/o chest pain with a normal stress echo in 2010 and also a negative myoview in 03/2014.  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  He says that over the past year or more, he has been having exertional dyspnea, noted mostly with higher levels of activity like taking out the trash or walking up stairs.  He has not been having exertional chest pain.  He was in his USOH until about 7:30 this morning when he focal 5/10 left chest discomfort, which he describes as "dull and like a knot in his chest".  The pain did not radiate and he had no associated dyspnea, n, v, or diaphoresis.  Discomfort was somewhat better with palpation and somewhat worse with lying back.  There was no change with deep breathing or position changes.  He took ASA 81mg  and HTN medications without improvement. After talking with his family, he presented to ED for further evaluation. He was given sl ntg with some, though not complete relief. He continues to report 2/10 focal  left chest discomfort.  ECG has nonspecific changes in lat leads.  Initial troponin is nl. He denies palpitation, cough, abdominal pain, N/V/D or LE edema.  Home Medications  Prior to Admission medications   Medication Sig Start Date End Date Taking? Authorizing Provider  amLODipine (NORVASC) 10 MG tablet Take 10 mg by mouth daily.   Yes Historical Provider, MD  aspirin 81 MG tablet Take 81 mg by mouth daily.   Yes Historical Provider, MD  hydrochlorothiazide (HYDRODIURIL) 25 MG tablet Take 25 mg by mouth daily. 11/20/14  Yes Historical Provider, MD  losartan (COZAAR) 25 MG tablet Take 25 mg by mouth daily. 10/05/14  Yes Historical Provider, MD  sulfamethoxazole-trimethoprim (BACTRIM DS,SEPTRA DS) 800-160 MG per tablet Take 2 tablets by mouth 2 (two) times daily. Started on 11-20-14 11/20/14  Yes Historical Provider, MD  lisinopril-hydrochlorothiazide (PRINZIDE,ZESTORETIC) 20-12.5 MG per tablet Take 1 tablet by mouth 2 (two) times daily.    Historical Provider, MD    Family History  Family History  Problem Relation Age of Onset  . Diabetes Mother   . Hypertension Mother   . Cancer Mother   . Heart disease Mother     bypass in 50s  . Heart attack Maternal Grandfather     massive MI at age 63  . Hypertension Father   .  Stroke Father   . Heart disease Father     bypass in 1s    Social History  History   Social History  . Marital Status: Married    Spouse Name: N/A  . Number of Children: N/A  . Years of Education: N/A   Occupational History  . Not on file.   Social History Main Topics  . Smoking status: Never Smoker   . Smokeless tobacco: Not on file  . Alcohol Use: No  . Drug Use: No  . Sexual Activity: Not on file   Other Topics Concern  . 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  Lives in [place] with wife.  Does not routinely exercise.     Review of Systems General:  No chills, fever, night sweats or weight  changes.  Cardiovascular:  + chest pain, + Sob w/ exertion. No edema, orthopnea, palpitations, paroxysmal nocturnal dyspnea. Dermatological: No rash, lesions/masses Respiratory: No cough, + dyspnea Urologic: No hematuria, dysuria Abdominal:   No nausea, vomiting, diarrhea, bright red blood per rectum, melena, or hematemesis Neurologic:  No visual changes, wkns, changes in mental status. MSK: low back/buttock pain r/t recently dx cyst for which he is on abx. All other systems reviewed and are otherwise negative except as noted above.  Physical Exam  Blood pressure 111/71, pulse 73, temperature 98.2 F (36.8 C), temperature source Oral, resp. rate 22, SpO2 98 %.  General: Pleasant, NAD, he is anxious.  Psych: Normal affect. Neuro: Alert and oriented X 3. Moves all extremities spontaneously. HEENT: Normal  Neck: Supple without bruits or JVD. Lungs:  Resp regular and unlabored, CTA. Heart: RRR no s3, s4, or murmurs. Abdomen: Soft, non-tender, non-distended, BS + x 4.  Extremities: No clubbing, cyanosis or edema. DP/PT/Radials 2+ and equal bilaterally.  Labs  Troponin [facility] of Care Test)  Recent Labs  11/24/14 1013  TROPIPOC 0.01   Lab Results  Component Value Date   WBC 4.8 11/24/2014   HGB 14.3 11/24/2014   HCT 42.0 11/24/2014   MCV 88.1 11/24/2014   PLT 214 11/24/2014     Recent Labs Lab 11/24/14 1027  NA 140  K 3.5  CL 103  BUN 14  CREATININE 0.80  GLUCOSE 129*   Radiology/Studies  Dg Chest Port 1 View  11/24/2014   CLINICAL DATA:  54 year old male with a history of left chest pain  EXAM: PORTABLE CHEST - 1 VIEW  COMPARISON:  05/28/2009  FINDINGS: Cardiomediastinal silhouette within normal limits in size and contour. No evidence of pulmonary vascular congestion.  Low lung volumes accentuates the interstitium, with similar appearance to prior.  No confluent airspace disease, pneumothorax, pleural effusion.  No displaced fracture.  IMPRESSION: No radiographic  evidence of acute cardiopulmonary disease.  Signed,  Dulcy Fanny. Earleen Newport, DO  [facility]  [facility]   Electronically Signed   By: Corrie Mckusick D.O.   On: 11/24/2014 10:49   ECG  Tachy with HR 118. Non specific ST and T wave change.   ASSESSMENT AND PLAN  1. Unstable angina: Pt presents with focal, left chest pain that is somewhat better with palpation and somewhat worse with lying flat.  At this time there is no objective evidence of ischemia however repeat troponin is pending.  We reviewed his prior testing including nl stress echo in 2010, nl myoview in 03/2014, and CTA of the chest from 2010, which did in fact  show mild to moderate LAD calcification.  We will admit and cycle enzymes.  Add heparin, bb, asa, statin, and nitrate.  Check A1c given baseline mild hyperglycemia.  Given multiple risk factors, progressive dyspnea, and now recurrent c/p despite neg MV 8 mos ago, we have recommended diagnostic cath this afternoon.  Concerning is patients lack of energy and increased SOB with exertion.  Chance for false neg with myoview.  He is mulling it over.  Check echo.  2. HTN: BP stable in ED.  Cont home meds.  Add low dose bb given concern for [place].  3.  Lipids:  Status unknown.  Will check lipids and lft's.  Add statin in the setting of above CAD    4. OSA: Respiratory therapy for CPAP.  Patient using at home    5.  Morbid Obesity:  Will ask for dietary consult while hospitalized as he needs to lose a significant amount of weight.  Pending outcome of studies, may qualify for cardiac rehab.  6. Lower back cyst: Continue Bactrim DS.  Signed, Murray Hodgkins, NP 11/24/2014, 12:35 PM  Patient seen and examined  I agree with findings as noted above by Angelica Ran.  I have amended note to reflect my findings.  Patient comes in with chest pressure  No completely typical for angina  More concerning are his complaints of increased dyspnea with exertion.  He  attrib to deconditioning but CT from 5 years ago showed mod calcifications of the LAD    Discussed evaluation with stress test (he had one 5 years ago) or cath.  Knowing CT, I would favor cath  Patient reflecting   Will need statin.    Dorris Carnes

## 2014-11-24 NOTE — ED Notes (Signed)
Portable xray at bedside.

## 2014-11-24 NOTE — Progress Notes (Signed)
Discussed plan with Dr. Harrington Challenger. Cath not particularly revealing for dyspnea. Admitted with sinus tachycardia, DOE, lowest pulse ox of 90%, 97-98 at other times. Will plan to eval for PE - will start with d-dimer. If abnormal will need further imaging. Nursing will page on-call provider if d-dimer returns abnormal. He denies any recent travel, surgery, bedrest. Mother had history of blood clots, details unclear. Given late nature of evaluation he will remain inpatient overnight and plan to DC in AM if workup unremarkable. Patient agreeable to plan. Jimmy Dakin PA-C

## 2014-11-24 NOTE — Interval H&P Note (Signed)
Cath Lab Visit (complete for each Cath Lab visit)  Clinical Evaluation Leading to the Procedure:   ACS: No.  Non-ACS:    Anginal Classification: CCS IV  Anti-ischemic medical therapy: Minimal Therapy (1 class of medications)  Non-Invasive Test Results: No non-invasive testing performed  Prior CABG: No previous CABG      History and Physical Interval Note:  11/24/2014 4:39 PM  Jimmy Riley  has presented today for surgery, with the diagnosis of cp  The various methods of treatment have been discussed with the patient and family. After consideration of risks, benefits and other options for treatment, the patient has consented to  Procedure(s): LEFT HEART CATHETERIZATION WITH CORONARY ANGIOGRAM (N/A) as a surgical intervention .  The patient's history has been reviewed, patient examined, no change in status, stable for surgery.  I have reviewed the patient's chart and labs.  Questions were answered to the patient's satisfaction.     Sinclair Grooms

## 2014-11-24 NOTE — CV Procedure (Signed)
       PROCEDURE:  Left heart catheterization with selective coronary angiography, left ventriculogram.  INDICATIONS:  Unstable angina  The risks, benefits, and details of the procedure were explained to the patient.  The patient verbalized understanding and wanted to proceed.  Informed written consent was obtained.  PROCEDURE TECHNIQUE:  After Xylocaine anesthesia a 38F slender sheath was placed in the right radial artery with a single anterior needle wall stick.   IV Heparin was given.  Right coronary angiography was done using a Judkins R4 guide catheter.  Left coronary angiography was done using a Judkins L3.5 guide catheter.  Left ventriculography was done using a pigtail catheter.  A TR band was used for hemostasis.   CONTRAST:  Total of 60 cc.  COMPLICATIONS:  None.    HEMODYNAMICS:  Aortic pressure was 96/68; LV pressure was 99/4; LVEDP 9 mm Hg.  There was no gradient between the left ventricle and aorta.    ANGIOGRAPHIC DATA:   The left main coronary artery is widely patent.  The left anterior descending artery is a large vessel proximally. There is mild plaque in the mid vessel. There is a segment of intramyocardial LAD noted on the angiogram with compression during systole. There is no hemodynamically significant disease.  The left circumflex artery is a large, dominant vessel. There are 2 large obtuse marginals which are widely patent. The left PDA is large and widely patent.  The right coronary artery is a small, nondominant vessel which is patent.  LEFT VENTRICULOGRAM:  Left ventricular angiogram was done in the 30 RAO projection and revealed normal left ventricular wall motion and systolic function with an estimated ejection fraction of 60%.  LVEDP was 9 mmHg.  IMPRESSIONS:  1. Widely patent left main coronary artery. 2. Mild plaque in the mid left anterior descending artery.  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 3. Large dominant left circumflex artery.   . 4. Nondominant right coronary artery. 5. Normal left ventricular systolic function.  LVEDP 9 mmHg.  Ejection fraction 60%. 6.  Of note the patient had significant vasospasm in his right radial artery throughout the case. He also has tortuosity at his right subclavian. If emergency cardiac cath needed to be done, would consider right groin approach rather than right radial approach.  RECOMMENDATION:  No hemodynamically significant coronary artery disease. Continue aggressive preventive therapy. He needs risk factor modification including weight loss, blood pressure control and healthy eating habits.  Further cardiology plans per Dr. Harrington Challenger.

## 2014-11-24 NOTE — ED Notes (Signed)
Pt reports increase in chest discomfort, 3rd nitro given

## 2014-11-24 NOTE — ED Notes (Signed)
Patient states chest pain at 0700 this morning.  Mid chest pain with no radiation.   Patient describes as dull pain.   Patient states some SOB, with chest pain, denies other symptoms.

## 2014-11-24 NOTE — Progress Notes (Signed)
ANTICOAGULATION CONSULT NOTE - Initial Consult  Pharmacy Consult for Heparin Indication: chest pain/ACS  Allergies  Allergen Reactions  . Tetracyclines & Related Itching    Swelling in mouth, lips    Patient Measurements: Height: 5' 10.08" (178 cm) Weight: 250 lb (113.399 kg) IBW/kg (Calculated) : 73.18 Heparin Dosing Weight: 98 kg  Vital Signs: Temp: 98.2 F (36.8 C) (03/25 0955) Temp Source: Oral (03/25 0955) BP: 110/72 mmHg (03/25 1324) Pulse Rate: 76 (03/25 1324)  Labs:  Recent Labs  11/24/14 1008 11/24/14 1027 11/24/14 1331  HGB 14.4 14.3  --   HCT 42.4 42.0  --   PLT 214  --   --   CREATININE  --  0.80  --   TROPONINI  --   --  <0.03    CrCl cannot be calculated (Unknown ideal weight.).   Medical History: Past Medical History  Diagnosis Date  . HTN (hypertension)   . Testicular cancer 1990  . Bowel obstruction 1990  . Chest pain     a. 03/2014 - Myoview stress - normal nuclear study and normal LV function  . Family history of heart disease   . Chronic headache   . OSA on CPAP     Medications:   (Not in a hospital admission) Scheduled:   Infusions:  . [START ON 11/25/2014] sodium chloride      Assessment: 54yo male with history of HTN and OSA presents with chest pain. Pharmacy is consulted to dose heparin for ACS/chest pain. CBC is wnl, sCr 0.8, Trop neg x2.  Goal of Therapy:  Heparin level 0.3-0.7 units/ml Monitor platelets by anticoagulation protocol: Yes   Plan:  Give 4000 units bolus x 1 Start heparin infusion at 1300 units/hr Check anti-Xa level in 6 hours and daily while on heparin Continue to monitor H&H and platelets  Jimmy Riley. Jimmy Riley, PharmD Clinical Pharmacist Pager [phone] 11/24/2014,3:10 PM

## 2014-11-24 NOTE — ED Provider Notes (Signed)
CSN: [id number]     Arrival date & time 11/24/14  1448 History   First MD Initiated Contact with Patient 11/24/14 0957     Chief Complaint  Patient presents with  . Chest Pain      HPI Pt was seen at 1010. Per pt, c/o gradual onset and persistence of constant left sided chest "pain" that began approximately 0700 this morning PTA. Pt describes the CP as "dull," without radiation. Has been associated with SOB. Pt took ASA 81mg  without improvement in his symptoms. Pt states "it was just getting worse" so he came to the ED for evaluation. Denies palpitations, no cough, no abd pain, no N/V/D, no back pain.    Cards: Dr. Gwenlyn Found Past Medical History  Diagnosis Date  . HTN (hypertension)   . Testicular cancer 1990  . Bowel obstruction 1990  . Chest pain   . Family history of heart disease   . Chronic headache   . OSA on CPAP   . Normal cardiac stress test 03/2014   Past Surgical History  Procedure Laterality Date  . Bowel surgery  1990    obstruction   Family History  Problem Relation Age of Onset  . Diabetes Mother   . Hypertension Mother   . Cancer Mother   . Heart disease Mother     bypass in 106s  . Heart attack Maternal Grandfather     massive MI at age 20  . Hypertension Father   . Stroke Father   . Heart disease Father     bypass in 65s   History  Substance Use Topics  . Smoking status: Never Smoker   . Smokeless tobacco: Not on file  . Alcohol Use: No    Review of Systems ROS: Statement: All systems negative except as marked or noted in the HPI; Constitutional: Negative for fever and chills. ; ; Eyes: Negative for eye pain, redness and discharge. ; ; ENMT: Negative for ear pain, hoarseness, nasal congestion, sinus pressure and sore throat. ; ; Cardiovascular: +CP, SOB. Negative for palpitations, diaphoresis, and peripheral edema. ; ; Respiratory: Negative for cough, wheezing and stridor. ; ; Gastrointestinal: Negative for nausea, vomiting, diarrhea, abdominal pain,  blood in stool, hematemesis, jaundice and rectal bleeding. . ; ; Genitourinary: Negative for dysuria, flank pain and hematuria. ; ; Musculoskeletal: Negative for back pain and neck pain. Negative for swelling and trauma.; ; Skin: Negative for pruritus, rash, abrasions, blisters, bruising and skin lesion.; ; Neuro: Negative for headache, lightheadedness and neck stiffness. Negative for weakness, altered level of consciousness , altered mental status, extremity weakness, paresthesias, involuntary movement, seizure and syncope.      Allergies  Tetracyclines & related  Home Medications   Prior to Admission medications   Medication Sig Start Date End Date Taking? Authorizing Provider  amLODipine (NORVASC) 10 MG tablet Take 10 mg by mouth daily.    Historical Provider, MD  aspirin 81 MG tablet Take 81 mg by mouth daily.    Historical Provider, MD  lisinopril-hydrochlorothiazide (PRINZIDE,ZESTORETIC) 20-12.5 MG per tablet Take 1 tablet by mouth 2 (two) times daily.    Historical Provider, MD   BP 125/81 mmHg  Pulse 96  Temp(Src) 98.2 F (36.8 C) (Oral)  Resp 14  SpO2 90% Physical Exam  1015; Physical examination:  Nursing notes reviewed; Vital signs and O2 SAT reviewed;  Constitutional: Well developed, Well nourished, Well hydrated, In no acute distress; Head:  Normocephalic, atraumatic; Eyes: EOMI, PERRL, No scleral icterus; ENMT: Mouth and  pharynx normal, Mucous membranes moist; Neck: Supple, Full range of motion, No lymphadenopathy; Cardiovascular: Regular rate and rhythm, No murmur, rub, or gallop; Respiratory: Breath sounds clear & equal bilaterally, No rales, rhonchi, wheezes.  Speaking full sentences with ease, Normal respiratory effort/excursion; Chest: +left anterior chest wall tender to palp. No soft tissue crepitus, no deformity. Movement normal; Abdomen: Soft, Nontender, Nondistended, Normal bowel sounds; Genitourinary: No CVA tenderness; Extremities: Pulses normal, No tenderness, No  edema, No calf edema or asymmetry.; Neuro: AA&Ox3, Major CN grossly intact.  Speech clear. No gross focal motor or sensory deficits in extremities.; Skin: Color normal, Warm, Dry.   ED Course  Procedures     EKG Interpretation   Date/Time:  Friday November 24 2014 09:51:55 EDT Ventricular Rate:  118 PR Interval:  138 QRS Duration: 93 QT Interval:  326 QTC Calculation: 457 R Axis:   47 Text Interpretation:  Sinus tachycardia Low voltage, precordial leads  Probable anteroseptal infarct, old Minimal ST depression, diffuse leads  Baseline wander in lead(s) III aVF V2 V3 V4 V5 V6 When compared with ECG  of 05/28/2009 Nonspecific ST and T wave abnormality is now Present  Confirmed by [facility]  MD, Nunzio Cory [phone]) on 11/24/2014 10:44:57 AM      MDM  MDM Reviewed: previous chart, nursing note and vitals Reviewed previous: labs and ECG Interpretation: labs, ECG and x-ray   Results for orders placed or performed during the hospital encounter of 11/24/14  CBC with Differential  Result Value Ref Range   WBC 4.8 4.0 - 10.5 K/uL   RBC 4.81 4.22 - 5.81 MIL/uL   Hemoglobin 14.4 13.0 - 17.0 g/dL   HCT 42.4 39.0 - 52.0 %   MCV 88.1 78.0 - 100.0 fL   MCH 29.9 26.0 - 34.0 pg   MCHC 34.0 30.0 - 36.0 g/dL   RDW 12.5 11.5 - 15.5 %   Platelets 214 150 - 400 K/uL   Neutrophils Relative % 54 43 - 77 %   Neutro Abs 2.6 1.7 - 7.7 K/uL   Lymphocytes Relative 25 12 - 46 %   Lymphs Abs 1.2 0.7 - 4.0 K/uL   Monocytes Relative 12 3 - 12 %   Monocytes Absolute 0.6 0.1 - 1.0 K/uL   Eosinophils Relative 8 (H) 0 - 5 %   Eosinophils Absolute 0.4 0.0 - 0.7 K/uL   Basophils Relative 1 0 - 1 %   Basophils Absolute 0.0 0.0 - 0.1 K/uL  I-stat Chem 8, ED  Result Value Ref Range   Sodium 140 135 - 145 mmol/L   Potassium 3.5 3.5 - 5.1 mmol/L   Chloride 103 96 - 112 mmol/L   BUN 14 6 - 23 mg/dL   Creatinine, Ser 0.80 0.50 - 1.35 mg/dL   Glucose, Bld 129 (H) 70 - 99 mg/dL   Calcium, Ion 1.14 1.12 - 1.23  mmol/L   TCO2 20 0 - 100 mmol/L   Hemoglobin 14.3 13.0 - 17.0 g/dL   HCT 42.0 39.0 - 52.0 %  I-stat troponin, ED  Result Value Ref Range   Troponin i, poc 0.01 0.00 - 0.08 ng/mL   Comment 3           Dg Chest Port 1 View 11/24/2014   CLINICAL DATA:  54 year old male with a history of left chest pain  EXAM: PORTABLE CHEST - 1 VIEW  COMPARISON:  05/28/2009  FINDINGS: Cardiomediastinal silhouette within normal limits in size and contour. No evidence of pulmonary vascular congestion.  Low  lung volumes accentuates the interstitium, with similar appearance to prior.  No confluent airspace disease, pneumothorax, pleural effusion.  No displaced fracture.  IMPRESSION: No radiographic evidence of acute cardiopulmonary disease.  Signed,  Dulcy Fanny. Earleen Newport, DO  [facility]  [facility]   Electronically Signed   By: Corrie Mckusick D.O.   On: 11/24/2014 10:49     1040:  Symptoms improved after ASA and SL ntg x2. Several ACS risk factors. Dx and testing d/w pt.  Questions answered.  Verb understanding, agreeable to admit.  T/C to Cards service, case discussed, including:  HPI, pertinent PM/SHx, VS/PE, dx testing, ED course and treatment:  Agreeable to come to ED for evaluation for admit.   Francine Graven, DO 11/27/14 [phone]

## 2014-11-24 NOTE — Interval H&P Note (Signed)
Cath Lab Visit (complete for each Cath Lab visit)  Clinical Evaluation Leading to the Procedure:   ACS: Yes.    Non-ACS:    Anginal Classification: CCS IV  Anti-ischemic medical therapy: Minimal Therapy (1 class of medications)  Non-Invasive Test Results: No non-invasive testing performed  Prior CABG: No previous CABG   TIMI SCORE  Patient Information:  TIMI Score is 1  UA/NSTEMI and low-risk features (e.g., TIMI score  Revascularization of the presumed culprit artery    [name]  Indication: 9; Score: 6    History and Physical Interval Note:  11/24/2014 4:47 PM  Jimmy Riley  has presented today for surgery, with the diagnosis of cp  The various methods of treatment have been discussed with the patient and family. After consideration of risks, benefits and other options for treatment, the patient has consented to  Procedure(s): LEFT HEART CATHETERIZATION WITH CORONARY ANGIOGRAM (N/A) as a surgical intervention .  The patient's history has been reviewed, patient examined, no change in status, stable for surgery.  I have reviewed the patient's chart and labs.  Questions were answered to the patient's satisfaction.     Ardeth Repetto S.

## 2014-11-24 NOTE — ED Notes (Signed)
Stress test a year ago normal. MD at bedside.

## 2014-11-25 DIAGNOSIS — I10 Essential (primary) hypertension: Secondary | ICD-10-CM

## 2014-11-25 DIAGNOSIS — R0902 Hypoxemia: Secondary | ICD-10-CM | POA: Diagnosis present

## 2014-11-25 DIAGNOSIS — R0602 Shortness of breath: Secondary | ICD-10-CM

## 2014-11-25 DIAGNOSIS — Q245 Malformation of coronary vessels: Secondary | ICD-10-CM

## 2014-11-25 DIAGNOSIS — R079 Chest pain, unspecified: Secondary | ICD-10-CM

## 2014-11-25 LAB — COMPREHENSIVE METABOLIC PANEL
ALT: 33 U/L (ref 0–53)
ANION GAP: 6 (ref 5–15)
AST: 23 U/L (ref 0–37)
Albumin: 3.6 g/dL (ref 3.5–5.2)
Alkaline Phosphatase: 56 U/L (ref 39–117)
BUN: 12 mg/dL (ref 6–23)
CO2: 28 mmol/L (ref 19–32)
Calcium: 8.8 mg/dL (ref 8.4–10.5)
Chloride: 102 mmol/L (ref 96–112)
Creatinine, Ser: 1 mg/dL (ref 0.50–1.35)
GFR, EST NON AFRICAN AMERICAN: 84 mL/min — AB (ref >90)
Glucose, Bld: 111 mg/dL — ABNORMAL HIGH (ref 70–99)
POTASSIUM: 4 mmol/L (ref 3.5–5.1)
Sodium: 136 mmol/L (ref 135–145)
TOTAL PROTEIN: 6.3 g/dL (ref 6.0–8.3)
Total Bilirubin: 0.9 mg/dL (ref 0.3–1.2)

## 2014-11-25 LAB — CBC
HCT: 41.2 % (ref 39.0–52.0)
Hemoglobin: 13.6 g/dL (ref 13.0–17.0)
MCH: 29.7 pg (ref 26.0–34.0)
MCHC: 33 g/dL (ref 30.0–36.0)
MCV: 90 fL (ref 78.0–100.0)
PLATELETS: 211 10*3/uL (ref 150–400)
RBC: 4.58 MIL/uL (ref 4.22–5.81)
RDW: 12.6 % (ref 11.5–15.5)
WBC: 6.7 10*3/uL (ref 4.0–10.5)

## 2014-11-25 LAB — LIPID PANEL
Cholesterol: 195 mg/dL (ref 0–200)
HDL: 30 mg/dL — ABNORMAL LOW (ref >39)
LDL Cholesterol: 148 mg/dL — ABNORMAL HIGH (ref 0–99)
TRIGLYCERIDES: 87 mg/dL (ref <150)
Total CHOL/HDL Ratio: 6.5 RATIO
VLDL: 17 mg/dL (ref 0–40)

## 2014-11-25 NOTE — Progress Notes (Signed)
Tech offered assistance with a bath, Pt stated he will take care of bath. Bath supplies given to Pt.

## 2014-11-25 NOTE — Progress Notes (Signed)
  Echocardiogram 2D Echocardiogram has been performed.  Jimmy Riley 11/25/2014, 9:50 AM

## 2014-11-25 NOTE — Discharge Instructions (Signed)
PLEASE REMEMBER TO BRING ALL OF YOUR MEDICATIONS TO EACH OF YOUR FOLLOW-UP OFFICE VISITS. ° °PLEASE ATTEND ALL SCHEDULED FOLLOW-UP APPOINTMENTS.  ° °Activity: Increase activity slowly as tolerated. You may shower, but no soaking baths (or swimming) for 1 week. No driving for 2 days. No lifting over 5 lbs for 1 week. No sexual activity for 1 week.  ° °You May Return to Work: in 1 week (if applicable) ° °Wound Care: You may wash cath site gently with soap and water. Keep cath site clean and dry. If you notice pain, swelling, bleeding or pus at your cath site, please call [phone]. ° ° ° °Cardiac Cath Site Care °Refer to this sheet in the next few weeks. These instructions provide you with information on caring for yourself after your procedure. Your caregiver may also give you more specific instructions. Your treatment has been planned according to current medical practices, but problems sometimes occur. Call your caregiver if you have any problems or questions after your procedure. °HOME CARE INSTRUCTIONS °· You may shower 24 hours after the procedure. Remove the bandage (dressing) and gently wash the site with plain soap and water. Gently pat the site dry.  °· Do not apply powder or lotion to the site.  °· Do not sit in a bathtub, swimming pool, or whirlpool for 5 to 7 days.  °· No bending, squatting, or lifting anything over 10 pounds (4.5 kg) as directed by your caregiver.  °· Inspect the site at least twice daily.  °· Do not drive home if you are discharged the same day of the procedure. Have someone else drive you.  °· You may drive 24 hours after the procedure unless otherwise instructed by your caregiver.  °What to expect: °· Any bruising will usually fade within 1 to 2 weeks.  °· Blood that collects in the tissue (hematoma) may be painful to the touch. It should usually decrease in size and tenderness within 1 to 2 weeks.  °SEEK IMMEDIATE MEDICAL CARE IF: °· You have unusual pain at the site or down the  affected limb.  °· You have redness, warmth, swelling, or pain at the site.  °· You have drainage (other than a small amount of blood on the dressing).  °· You have chills.  °· You have a fever or persistent symptoms for more than 72 hours.  °· You have a fever and your symptoms suddenly get worse.  °· Your leg becomes pale, cool, tingly, or numb.  °· You have heavy bleeding from the site. Hold pressure on the site.  °Document Released: 09/20/2010 Document Revised: 08/07/2011 Document Reviewed: 09/20/2010 °[employer]® Patient Information ©2012 [employer]. ° °

## 2014-11-25 NOTE — Progress Notes (Signed)
Tech walked with Pt using 02 sats meter. Before walking 02 reading was 95% on RA during walk 02 level increase ranging from 95%-98% on RA.

## 2014-11-25 NOTE — Discharge Summary (Signed)
CARDIOLOGY DISCHARGE SUMMARY   Patient ID: Jimmy Riley MRN: [id number] DOB/AGE: April 11, 1961 54 y.o.  Admit date: 11/24/2014 Discharge date: 11/25/2014  PCP: Florina Ou, MD Primary Cardiologist: Dr. Gwenlyn Found  Primary Discharge Diagnosis:  Unstable angina Secondary Discharge Diagnosis:    Essential hypertension   Hypoxia  Procedures: Left heart catheterization with selective coronary angiography, left ventriculogram, 2-D echocardiogram  Hospital Course: Jimmy Riley is a 54 y.o. male with no history of CAD, but with multiple cardiac risk factors. He came to the emergency room with chest pain and shortness of breath. He was hypoxic. He was admitted for further evaluation and treatment.  His initial cardiac enzymes were negative for MI. There was concern for his symptoms being consistent with unstable anginal pain so he was taken to the cath lab on 11/24/2014. Cardiac catheterization results are below. He had nonobstructive disease and cardiac risk factor reduction is recommended.  His oxygen saturation was documented as low as 85% on room air. A chest x-ray was performed but showed no acute disease. A d-dimer was checked but was also within normal limits. Lipid profile was checked, results below. He is encouraged to eat a heart-healthy diet. His BMI is 35 so some weight reduction would also be helpful.  On 11/25/2014, he was seen by Dr. Bronson Ing and all data were reviewed. He ambulated well and his oxygen saturation remained greater than 95% on room air with ambulation. He had no chest pain or shortness of breath. His cath site was without ecchymosis or hematoma. His labs did not show any signs of infection or significant electrolyte abnormalities. No further inpatient workup was indicated and he is considered stable for discharge, to follow-up as an outpatient.   Of note, a 2-D echocardiogram was performed prior to discharge, results are pending.  Labs:   Lab Results    Component Value Date   WBC 6.7 11/25/2014   HGB 13.6 11/25/2014   HCT 41.2 11/25/2014   MCV 90.0 11/25/2014   PLT 211 11/25/2014     Recent Labs Lab 11/25/14 0429  NA 136  K 4.0  CL 102  CO2 28  BUN 12  CREATININE 1.00  CALCIUM 8.8  PROT 6.3  BILITOT 0.9  ALKPHOS 56  ALT 33  AST 23  GLUCOSE 111*    Recent Labs  11/24/14 1331  TROPONINI <0.03   Lipid Panel     Component Value Date/Time   CHOL 195 11/25/2014 0429   TRIG 87 11/25/2014 0429   HDL 30* 11/25/2014 0429   CHOLHDL 6.5 11/25/2014 0429   VLDL 17 11/25/2014 0429   LDLCALC 148* 11/25/2014 0429     Radiology: Dg Chest Port 1 View 11/24/2014   CLINICAL DATA:  54 year old male with a history of left chest pain  EXAM: PORTABLE CHEST - 1 VIEW  COMPARISON:  05/28/2009  FINDINGS: Cardiomediastinal silhouette within normal limits in size and contour. No evidence of pulmonary vascular congestion.  Low lung volumes accentuates the interstitium, with similar appearance to prior.  No confluent airspace disease, pneumothorax, pleural effusion.  No displaced fracture.  IMPRESSION: No radiographic evidence of acute cardiopulmonary disease.  Signed,  Dulcy Fanny. Earleen Newport, DO  [facility]  [facility]   Electronically Signed   By: Corrie Mckusick D.O.   On: 11/24/2014 10:49   Cardiac Cath: 11/24/2014 HEMODYNAMICS: Aortic pressure was 96/68; LV pressure was 99/4; LVEDP 9 mm Hg. There was no gradient between the left ventricle and aorta.  ANGIOGRAPHIC DATA: The left main coronary artery is widely patent. The left anterior descending artery is a large vessel proximally. There is mild plaque in the mid vessel. There is a segment of intramyocardial LAD noted on the angiogram with compression during systole. There is no hemodynamically significant disease. The left circumflex artery is a large, dominant vessel. There are 2 large obtuse marginals which are widely patent. The left PDA is large  and widely patent. The right coronary artery is a small, nondominant vessel which is patent. LEFT VENTRICULOGRAM: Left ventricular angiogram was done in the 30 RAO projection and revealed normal left ventricular wall motion and systolic function with an estimated ejection fraction of 60%. LVEDP was 9 mmHg. IMPRESSIONS: 1. Widely patent left main coronary artery. 2. Mild plaque in the mid left anterior descending artery. 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 3. Large dominant left circumflex artery. . 4. Nondominant right coronary artery. 5. Normal left ventricular systolic function. LVEDP 9 mmHg. Ejection fraction 60%. 6. Of note the patient had significant vasospasm in his right radial artery throughout the case. He also has tortuosity at his right subclavian. If emergency cardiac cath needed to be done, would consider right groin approach rather than right radial approach. RECOMMENDATION: No hemodynamically significant coronary artery disease. Continue aggressive preventive therapy. He needs risk factor modification including weight loss, blood pressure control and healthy eating habits. Further cardiology plans per Dr. Harrington Challenger.   EKG: Sinus rhythm/sinus bradycardia, no acute ischemic changes  Echo: Pending  FOLLOW UP PLANS AND APPOINTMENTS Allergies  Allergen Reactions  . Ace Inhibitors Cough  . Tetracyclines & Related Itching    Swelling in mouth, lips     Medication List    STOP taking these medications        lisinopril-hydrochlorothiazide 20-12.5 MG per tablet  Commonly known as:  PRINZIDE,ZESTORETIC      TAKE these medications        amLODipine 10 MG tablet  Commonly known as:  NORVASC  Take 10 mg by mouth daily.     aspirin 81 MG tablet  Take 81 mg by mouth daily.     hydrochlorothiazide 25 MG tablet  Commonly known as:  HYDRODIURIL  Take 25 mg by mouth daily.     losartan 25 MG tablet  Commonly known as:  COZAAR  Take 25 mg by mouth  daily.     sulfamethoxazole-trimethoprim 800-160 MG per tablet  Commonly known as:  BACTRIM DS,SEPTRA DS  Take 2 tablets by mouth 2 (two) times daily. Started on 11-20-14         Follow-up Information    Follow up with Lorretta Harp, MD.   Specialty:  Cardiology   Why:  The office will call.   Contact information:   [place] [address] [phone]       BRING ALL MEDICATIONS WITH YOU TO FOLLOW UP APPOINTMENTS  Time spent with patient to include physician time: 42 min Signed: Rosaria Ferries, PA-C 11/25/2014, 9:22 AM Co-Sign MD

## 2014-11-25 NOTE — Progress Notes (Signed)
Patient Name: Jimmy Riley Date of Encounter: 11/25/2014  Principal Problem:   Unstable angina Active Problems:   Essential hypertension   Hypoxia   Primary Cardiologist: Dr. Gwenlyn Found  Patient Profile: 54 year old male with a hx of hypertension, OSA (on CPAP) and strong family hx of heart disease with both parents with coronary bypass was admitted 03/25 w/ chest pain, dyspnea. O2 sats 90% at one point. Cath w/ non-obs dz and EF normal.   SUBJECTIVE: No more chest pain or SOB  OBJECTIVE Filed Vitals:   11/25/14 0046 11/25/14 0500 11/25/14 0527 11/25/14 0721  BP: 107/73  114/78 113/86  Pulse: 79  73 77  Temp: 97.9 F (36.6 C)  97.8 F (36.6 C) 98.1 F (36.7 C)  TempSrc: Oral  Oral Oral  Resp: 18  17 21   Height:      Weight: 250 lb (113.399 kg) 249 lb 12.5 oz (113.3 kg)    SpO2: 94%  93% 97%    Intake/Output Summary (Last 24 hours) at 11/25/14 0830 Last data filed at 11/25/14 0746  Gross per 24 hour  Intake  823.4 ml  Output   1050 ml  Net -226.6 ml   Filed Weights   11/24/14 1500 11/25/14 0046 11/25/14 0500  Weight: 250 lb (113.399 kg) 250 lb (113.399 kg) 249 lb 12.5 oz (113.3 kg)    PHYSICAL EXAM General: Well developed, well nourished, male in no acute distress. Head: Normocephalic, atraumatic.  Neck: Supple without bruits, JVD not elevated. Lungs:  Resp regular and unlabored, CTA. Heart: RRR, S1, S2, no S3, S4, no murmur; no rub. Abdomen: Soft, non-tender, non-distended, BS + x 4.  Extremities: No clubbing, cyanosis, no edema. Right radial cath site without ecchymosis or hematoma Neuro: Alert and oriented X 3. Moves all extremities spontaneously. Psych: Normal affect.  LABS: CBC:  Recent Labs  11/24/14 1008 11/24/14 1027 11/25/14 0429  WBC 4.8  --  6.7  NEUTROABS 2.6  --   --   HGB 14.4 14.3 13.6  HCT 42.4 42.0 41.2  MCV 88.1  --  90.0  PLT 214  --  403   Basic Metabolic Panel:  Recent Labs  11/24/14 1027 11/25/14 0429  NA 140 136   K 3.5 4.0  CL 103 102  CO2  --  28  GLUCOSE 129* 111*  BUN 14 12  CREATININE 0.80 1.00  CALCIUM  --  8.8   Liver Function Tests:  Recent Labs  11/25/14 0429  AST 23  ALT 33  ALKPHOS 56  BILITOT 0.9  PROT 6.3  ALBUMIN 3.6   Cardiac Enzymes:  Recent Labs  11/24/14 1331  TROPONINI <0.03    Recent Labs  11/24/14 1013  TROPIPOC 0.01   D-dimer:  Recent Labs  11/24/14 1950  DDIMER 0.32   Fasting Lipid Panel:  Recent Labs  11/25/14 0429  CHOL 195  HDL 30*  LDLCALC 148*  TRIG 87  CHOLHDL 6.5   Thyroid Function Tests:  Recent Labs  11/24/14 1950  TSH 1.608   TELE: SR, no sig ectopy     Radiology/Studies: Dg Chest Port 1 View 11/24/2014   CLINICAL DATA:  54 year old male with a history of left chest pain  EXAM: PORTABLE CHEST - 1 VIEW  COMPARISON:  05/28/2009  FINDINGS: Cardiomediastinal silhouette within normal limits in size and contour. No evidence of pulmonary vascular congestion.  Low lung volumes accentuates the interstitium, with similar appearance to prior.  No confluent airspace disease,  pneumothorax, pleural effusion.  No displaced fracture.  IMPRESSION: No radiographic evidence of acute cardiopulmonary disease.  Signed,  Dulcy Fanny. Earleen Newport, DO  [facility]  [facility]   Electronically Signed   By: Corrie Mckusick D.O.   On: 11/24/2014 10:49   Current Medications:  . amLODipine  10 mg Oral Daily  . aspirin EC  81 mg Oral Daily  . atorvastatin  80 mg Oral q1800  . hydrochlorothiazide  25 mg Oral Daily  . losartan  25 mg Oral Daily  . metoprolol tartrate  12.5 mg Oral BID  . nitroGLYCERIN  0.5 inch Topical 4 times per day  . sulfamethoxazole-trimethoprim  2 tablet Oral BID   . sodium chloride     Cath:  IMPRESSIONS: 1. Widely patent left main coronary artery. 2. Mild plaque in the mid left anterior descending artery. 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 3. Large dominant left  circumflex artery. . 4. Nondominant right coronary artery. 5. Normal left ventricular systolic function. LVEDP 9 mmHg. Ejection fraction 60%. 6. Of note the patient had significant vasospasm in his right radial artery throughout the case. He also has tortuosity at his right subclavian. If emergency cardiac cath needed to be done, would consider right groin approach rather than right radial approach.  ASSESSMENT AND PLAN: Principal Problem:   Unstable angina - cath w/ non-obs dz, EF nl - no recurrence of pain - med rx, CRF reduction for non-obs plaque  Active Problems:   Essential hypertension - BB added - BP has been well-controlled here    Hypoxia - initial sats in the 80s - no sig abnormality on CXR - d-dimer normal - ck sats w/ ambulation  Plan - d/c if all is OK  Signed, Rosaria Ferries , PA-C 8:30 AM 11/25/2014  The patient was seen and examined, and I agree with the assessment and plan as documented above, with modifications as noted below. Cath revealed myocardial bridging and normal LV systolic function. Already on amlodipine, and low-dose metoprolol has been added. No further complaints of chest pain or SOB. Denies fevers/chills. Ambulated with nurse with O2 sats >95% and without symptoms. D-dimer normal. Will obtain echocardiogram prior to discharge.

## 2014-11-26 DIAGNOSIS — R0600 Dyspnea, unspecified: Secondary | ICD-10-CM

## 2014-11-27 LAB — HEMOGLOBIN A1C
HEMOGLOBIN A1C: 6.1 % — AB (ref 4.8–5.6)
Mean Plasma Glucose: 128 mg/dL

## 2014-11-29 ENCOUNTER — Telehealth: Payer: Self-pay | Admitting: Cardiovascular Disease

## 2014-12-01 NOTE — Telephone Encounter (Signed)
Closed encounter °

## 2015-05-04 ENCOUNTER — Other Ambulatory Visit: Payer: Self-pay | Admitting: Sports Medicine

## 2015-05-04 DIAGNOSIS — IMO0002 Reserved for concepts with insufficient information to code with codable children: Secondary | ICD-10-CM

## 2015-05-13 ENCOUNTER — Ambulatory Visit
Admission: RE | Admit: 2015-05-13 | Discharge: 2015-05-13 | Disposition: A | Discharge status: Home / Self Care | Payer: 59 | Source: Ambulatory Visit | Attending: Sports Medicine | Admitting: Sports Medicine

## 2015-05-13 DIAGNOSIS — IMO0002 Reserved for concepts with insufficient information to code with codable children: Secondary | ICD-10-CM

## 2015-12-04 ENCOUNTER — Encounter: Payer: Self-pay | Admitting: Family Medicine

## 2015-12-04 ENCOUNTER — Ambulatory Visit (INDEPENDENT_AMBULATORY_CARE_PROVIDER_SITE_OTHER): Payer: 59 | Admitting: Family Medicine

## 2015-12-04 VITALS — BP 120/84 | HR 78 | Temp 98.1°F | Resp 14 | Ht 70.0 in | Wt 258.0 lb

## 2015-12-04 DIAGNOSIS — Z7689 Persons encountering health services in other specified circumstances: Secondary | ICD-10-CM

## 2015-12-04 DIAGNOSIS — Z0000 Encounter for general adult medical examination without abnormal findings: Secondary | ICD-10-CM

## 2015-12-04 DIAGNOSIS — R5383 Other fatigue: Secondary | ICD-10-CM

## 2015-12-04 DIAGNOSIS — Z1159 Encounter for screening for other viral diseases: Secondary | ICD-10-CM | POA: Diagnosis not present

## 2015-12-04 DIAGNOSIS — I10 Essential (primary) hypertension: Secondary | ICD-10-CM

## 2015-12-04 DIAGNOSIS — Z1211 Encounter for screening for malignant neoplasm of colon: Secondary | ICD-10-CM

## 2015-12-04 DIAGNOSIS — Z7189 Other specified counseling: Secondary | ICD-10-CM

## 2015-12-04 MED ORDER — LOSARTAN POTASSIUM 100 MG PO TABS: 100.0000 mg | ORAL_TABLET | Freq: Every day | ORAL | Status: DC

## 2015-12-04 NOTE — Progress Notes (Signed)
Subjective:    Patient ID: Jimmy Riley, male    DOB: 1960-12-14, 55 y.o.   MRN: [id number]  HPI Patient is a very pleasant 55 year old white male here today to establish care. 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 He had an exploratory laparotomy to evaluate for any lymphatic spread. Lymph nodes were clear. However he did develop a postoperative bowel obstruction requiring repeat laparotomy and surgical correction. He also has a history of hypertension as well as obesity. He is overdue for colon cancer screening. He is due for hepatitis C screening. He is due for prostate exam. Immunizations are up-to-date. He also reports severe fatigue, lethargy, poor libido, and decreasing muscle mass. Past Medical History  Diagnosis Date  . HTN (hypertension)   . Testicular cancer ([place]) 1990  . Bowel obstruction ([place]) 1990  . Chest pain     a. 03/2014 - Myoview stress - normal nuclear study and normal LV function  . Family history of heart disease   . Chronic headache   . OSA on CPAP   . Bowel obstruction ([place])     after laparotomy to evaluate pelvic lymph nodes when he had left testicular cancer   Past Surgical History  Procedure Laterality Date  . Bowel surgery  1990    obstruction  . Left heart catheterization with coronary angiogram N/A 11/24/2014    Procedure: LEFT HEART CATHETERIZATION WITH CORONARY ANGIOGRAM;  Surgeon: Belva Crome, MD;  Location: [facility] CATH LAB;  Service: Cardiovascular;  Laterality: N/A;  . Appendectomy     Current Outpatient Prescriptions on File Prior to Visit  Medication Sig Dispense Refill  . amLODipine (NORVASC) 10 MG tablet Take 10 mg by mouth daily.    Marland Kitchen aspirin 81 MG tablet Take 81 mg by mouth daily.    . hydrochlorothiazide (HYDRODIURIL) 25 MG tablet Take 25 mg by mouth daily.    Marland Kitchen losartan (COZAAR) 25 MG tablet Take 25 mg by mouth daily.     No current facility-administered  medications on file prior to visit.   Allergies  Allergen Reactions  . Ace Inhibitors Cough  . Tetracyclines & Related Itching    Swelling in mouth, lips   Social History   Social History  . Marital Status: Married    Spouse Name: N/A  . Number of Children: N/A  . Years of Education: N/A   Occupational History  . Not on file.   Social History Main Topics  . Smoking status: Never Smoker   . Smokeless tobacco: Not on file  . Alcohol Use: No  . Drug Use: No  . Sexual Activity: Not on file   Other Topics Concern  . 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  Lives in [place] with wife.  Does not routinely exercise.   Family History  Problem Relation Age of Onset  . Diabetes Mother   . Hypertension Mother   . Cancer Mother   . Heart disease Mother     bypass in 48s  . Heart attack Maternal Grandfather     massive MI at age 32  . Hypertension Father   . Stroke Father   . Heart disease Father     bypass in 50s      Review of Systems  All other systems reviewed and are negative.      Objective:   Physical  Exam  Constitutional: He is oriented to person, place, and time. He appears well-developed and well-nourished. No distress.  HENT:  Head: Normocephalic and atraumatic.  Right Ear: External ear normal.  Left Ear: External ear normal.  Nose: Nose normal.  Mouth/Throat: Oropharynx is clear and moist. No oropharyngeal exudate.  Eyes: Conjunctivae and EOM are normal. Pupils are equal, round, and reactive to light. Right eye exhibits no discharge. Left eye exhibits no discharge. No scleral icterus.  Neck: Normal range of motion. Neck supple. No JVD present. No tracheal deviation present. No thyromegaly present.  Cardiovascular: Normal rate, regular rhythm, normal heart sounds and intact distal pulses.  Exam reveals no gallop and no friction rub.   No murmur heard. Pulmonary/Chest: Effort normal and breath sounds  normal. No stridor. No respiratory distress. He has no wheezes. He has no rales. He exhibits no tenderness.  Abdominal: Soft. Bowel sounds are normal. He exhibits no distension and no mass. There is no tenderness. There is no rebound and no guarding.  Genitourinary: Rectum normal, prostate normal and penis normal.  Musculoskeletal: Normal range of motion. He exhibits no edema or tenderness.  Lymphadenopathy:    He has no cervical adenopathy.  Neurological: He is alert and oriented to person, place, and time. He has normal reflexes. He displays normal reflexes. No cranial nerve deficit. He exhibits normal muscle tone. Coordination normal.  Skin: Skin is warm. No rash noted. He is not diaphoretic. No erythema. No pallor.  Psychiatric: He has a normal mood and affect. His behavior is normal. Judgment and thought content normal.  Vitals reviewed.  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 Ventral abdominal scar.        Assessment & Plan:  Routine general medical examination at a health care facility  Establishing care with new doctor, encounter for  Benign essential HTN  Special screening for malignant neoplasms, colon - Plan: Ambulatory referral to Gastroenterology  His exam today is normal except for obesity which I recommended diet exercise and weight loss. Patient's blood pressure at home has been averaging 140-150/90. This is checked by his nurse who is an Therapist, sports. I recommended increasing losartan from 25 mg a day to 100 mg a day and recheck blood pressure in one month. I will schedule the patient for colonoscopy. Prostate exam is normal. I would like him to return fasting for a CBC, CMP, fasting lipid panel along with a PSA. I will also check a testosterone level and a TSH given his fatigue

## 2015-12-06 IMAGING — CR DG CHEST 1V PORT
1 series · 1 of 1 positions shown · non-contrast
Comparison: 05/28/2009

CLINICAL DATA: 53-year-old male with a history of left chest pain

EXAM:
PORTABLE CHEST - 1 VIEW

[AP]
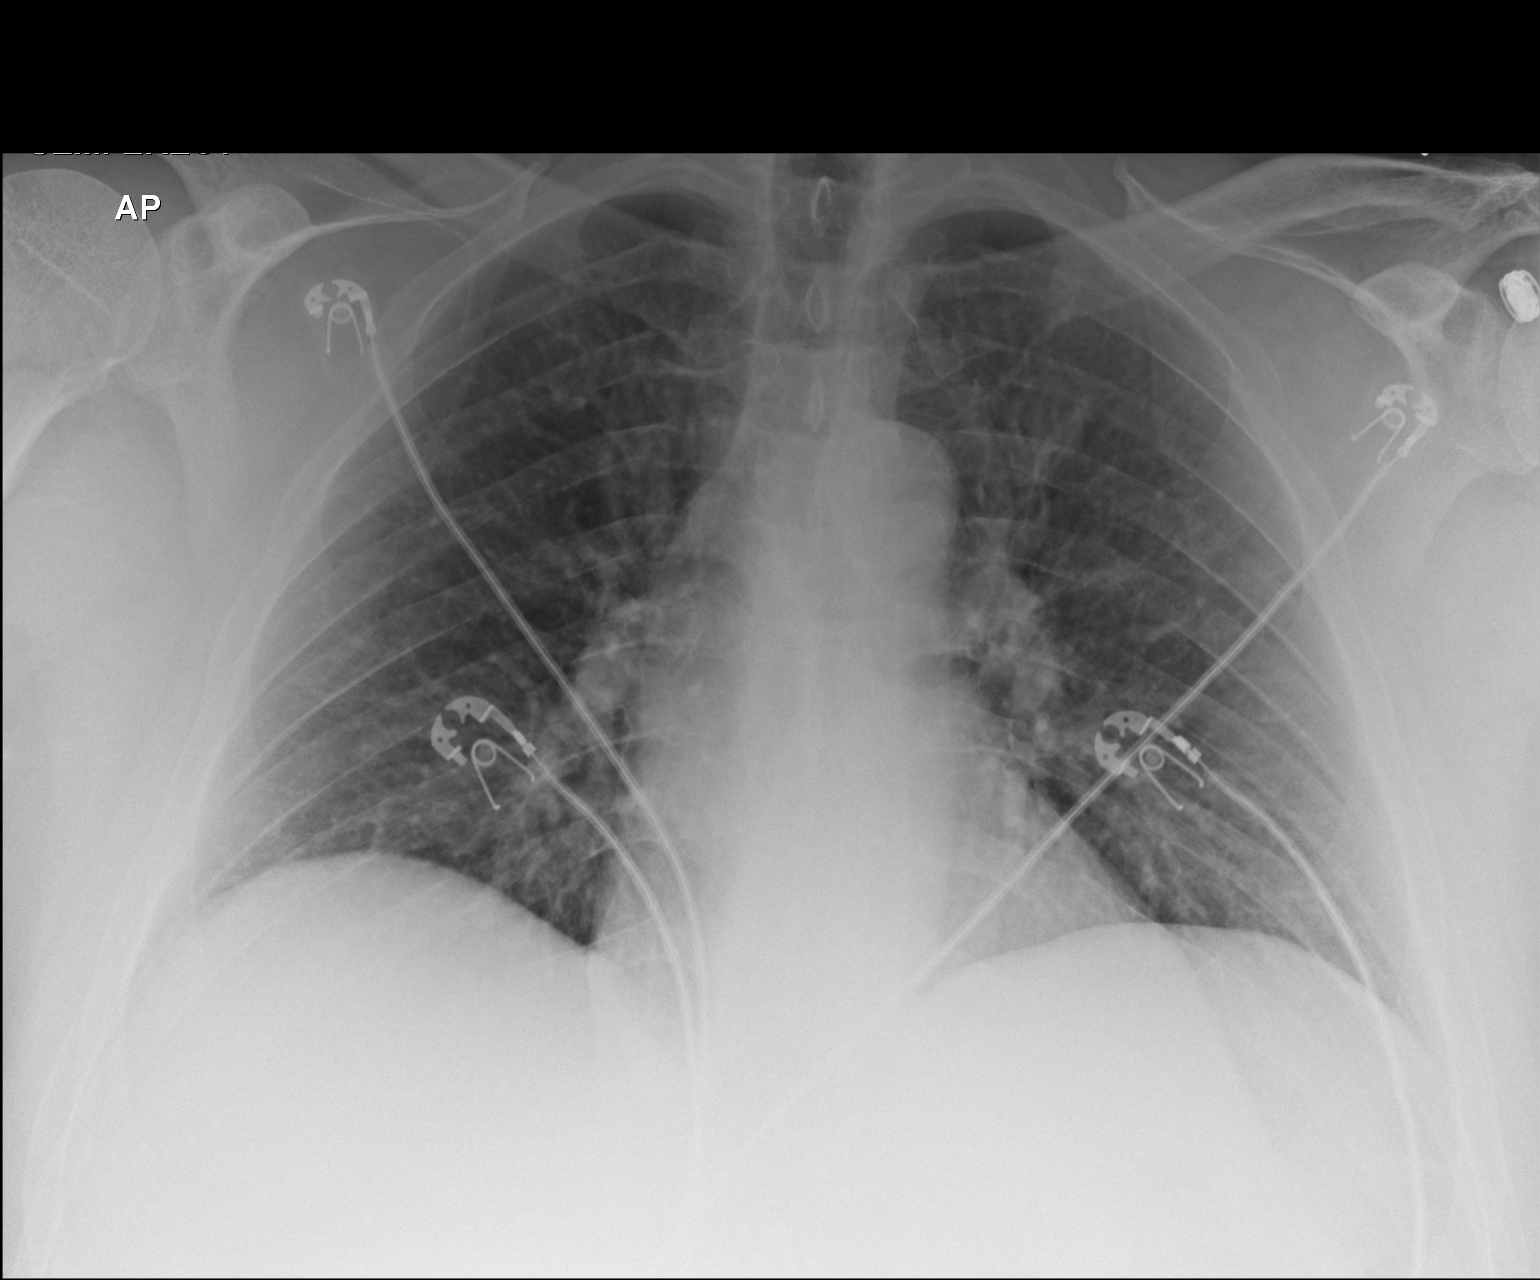

[1 of 1 positions shown; findings below may reference images not displayed]

FINDINGS: Cardiomediastinal silhouette within normal limits in size and
contour. No evidence of pulmonary vascular congestion.

Low lung volumes accentuates the interstitium, with similar
appearance to prior.

No confluent airspace disease, pneumothorax, pleural effusion.

No displaced fracture.
IMPRESSION: No radiographic evidence of acute cardiopulmonary disease.

## 2015-12-10 ENCOUNTER — Other Ambulatory Visit: Payer: 59

## 2015-12-10 ENCOUNTER — Other Ambulatory Visit: Payer: Self-pay | Admitting: Family Medicine

## 2015-12-11 LAB — CBC WITH DIFFERENTIAL/PLATELET
BASOS PCT: 1 %
Basophils Absolute: 54 cells/uL (ref 0–200)
EOS PCT: 4 %
Eosinophils Absolute: 216 cells/uL (ref 15–500)
HCT: 46.8 % (ref 38.5–50.0)
Hemoglobin: 15.8 g/dL (ref 13.0–17.0)
LYMPHS PCT: 27 %
Lymphs Abs: 1458 cells/uL (ref 850–3900)
MCH: 29.8 pg (ref 27.0–33.0)
MCHC: 33.8 g/dL (ref 32.0–36.0)
MCV: 88.3 fL (ref 80.0–100.0)
MONOS PCT: 7 %
MPV: 9.1 fL (ref 7.5–12.5)
Monocytes Absolute: 378 cells/uL (ref 200–950)
Neutro Abs: 3294 cells/uL (ref 1500–7800)
Neutrophils Relative %: 61 %
Platelets: 240 10*3/uL (ref 140–400)
RBC: 5.3 MIL/uL (ref 4.20–5.80)
RDW: 13.2 % (ref 11.0–15.0)
WBC: 5.4 10*3/uL (ref 3.8–10.8)

## 2015-12-11 LAB — LIPID PANEL
Cholesterol: 212 mg/dL — ABNORMAL HIGH (ref 125–200)
HDL: 37 mg/dL — AB (ref >=40)
LDL Cholesterol: 160 mg/dL — ABNORMAL HIGH (ref <130)
Total CHOL/HDL Ratio: 5.7 Ratio — ABNORMAL HIGH (ref <=5.0)
Triglycerides: 73 mg/dL (ref <150)
VLDL: 15 mg/dL (ref <30)

## 2015-12-11 LAB — COMPLETE METABOLIC PANEL WITH GFR
ALBUMIN: 4.4 g/dL (ref 3.6–5.1)
ALK PHOS: 48 U/L (ref 40–115)
ALT: 43 U/L (ref 9–46)
AST: 26 U/L (ref 10–35)
BILIRUBIN TOTAL: 0.8 mg/dL (ref 0.2–1.2)
BUN: 18 mg/dL (ref 7–25)
CALCIUM: 9 mg/dL (ref 8.6–10.3)
CO2: 25 mmol/L (ref 20–31)
Chloride: 100 mmol/L (ref 98–110)
Creat: 0.87 mg/dL (ref 0.70–1.33)
Glucose, Bld: 115 mg/dL — ABNORMAL HIGH (ref 70–99)
Potassium: 4 mmol/L (ref 3.5–5.3)
Sodium: 139 mmol/L (ref 135–146)
TOTAL PROTEIN: 7.2 g/dL (ref 6.1–8.1)

## 2015-12-11 LAB — TSH: TSH: 1.59 mIU/L (ref 0.40–4.50)

## 2015-12-11 LAB — TESTOSTERONE: Testosterone: 433 ng/dL (ref 250–827)

## 2015-12-11 LAB — HEPATITIS C ANTIBODY: HCV AB: NEGATIVE

## 2015-12-11 LAB — PSA: PSA: 0.35 ng/mL (ref <=4.00)

## 2015-12-12 LAB — HEMOGLOBIN A1C
Hgb A1c MFr Bld: 5.9 % — ABNORMAL HIGH (ref <5.7)
Mean Plasma Glucose: 123 mg/dL

## 2015-12-13 ENCOUNTER — Other Ambulatory Visit: Payer: Self-pay | Admitting: *Deleted

## 2015-12-19 ENCOUNTER — Other Ambulatory Visit: Payer: Self-pay | Admitting: Family Medicine

## 2015-12-19 NOTE — Telephone Encounter (Signed)
Refill appropriate and filled per protocol. 

## 2015-12-31 ENCOUNTER — Other Ambulatory Visit: Payer: Self-pay | Admitting: Family Medicine

## 2015-12-31 NOTE — Telephone Encounter (Signed)
Refill appropriate and filled per protocol. 

## 2016-01-14 ENCOUNTER — Encounter: Payer: Self-pay | Admitting: Family Medicine

## 2016-02-28 ENCOUNTER — Encounter (HOSPITAL_BASED_OUTPATIENT_CLINIC_OR_DEPARTMENT_OTHER): Admission: RE | Payer: Self-pay | Source: Ambulatory Visit

## 2016-02-28 ENCOUNTER — Ambulatory Visit (HOSPITAL_BASED_OUTPATIENT_CLINIC_OR_DEPARTMENT_OTHER)
Admission: RE | Admit: 2016-02-28 | Payer: Worker's Compensation | Source: Ambulatory Visit | Admitting: Orthopedic Surgery

## 2016-02-28 SURGERY — ARTHROSCOPY, KNEE, WITH MEDIAL MENISCECTOMY
Anesthesia: Choice | Site: Knee | Laterality: Right

## 2016-03-03 ENCOUNTER — Other Ambulatory Visit: Payer: Self-pay | Admitting: Family Medicine

## 2016-03-12 ENCOUNTER — Telehealth: Payer: Self-pay | Admitting: Family Medicine

## 2016-03-12 NOTE — Telephone Encounter (Signed)
Pt would like a referral to Dr. Liliane Channel office for gastro. Pt's wife has spoken with them and they will see him if we can fax over the information. Office phone: [phone] Office fax: [phone]

## 2016-03-14 ENCOUNTER — Encounter: Payer: Self-pay | Admitting: Family Medicine

## 2016-03-14 ENCOUNTER — Ambulatory Visit (INDEPENDENT_AMBULATORY_CARE_PROVIDER_SITE_OTHER): Payer: 59 | Admitting: Family Medicine

## 2016-03-14 VITALS — BP 108/74 | HR 92 | Temp 97.5°F | Resp 16 | Ht 70.0 in | Wt 245.0 lb

## 2016-03-14 DIAGNOSIS — I10 Essential (primary) hypertension: Secondary | ICD-10-CM

## 2016-03-14 DIAGNOSIS — E785 Hyperlipidemia, unspecified: Secondary | ICD-10-CM | POA: Diagnosis not present

## 2016-03-14 DIAGNOSIS — R7303 Prediabetes: Secondary | ICD-10-CM

## 2016-03-14 NOTE — Progress Notes (Signed)
Subjective:    Patient ID: Jimmy Riley, male    DOB: 12-25-1960, 55 y.o.   MRN: [id number]  HPI 12/04/15 Patient is a very pleasant 55 year old white male here today to establish care. 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 He had an exploratory laparotomy to evaluate for any lymphatic spread. Lymph nodes were clear. However he did develop a postoperative bowel obstruction requiring repeat laparotomy and surgical correction. He also has a history of hypertension as well as obesity. He is overdue for colon cancer screening. He is due for hepatitis C screening. He is due for prostate exam. Immunizations are up-to-date. He also reports severe fatigue, lethargy, poor libido, and decreasing muscle mass.  At that time, my plan was: His exam today is normal except for obesity which I recommended diet exercise and weight loss. Patient's blood pressure at home has been averaging 140-150/90. This is checked by his nurse who is an Therapist, sports. I recommended increasing losartan from 25 mg a day to 100 mg a day and recheck blood pressure in one month. I will schedule the patient for colonoscopy. Prostate exam is normal. I would like him to return fasting for a CBC, CMP, fasting lipid panel along with a PSA. I will also check a testosterone level and a TSH given his fatigue  03/14/16 Blood pressure is much better today at 108/74. Home blood pressures have been in the 120s over 70s. He denies any side effects from the increased dosage of losartan. LDL cholesterol was found to be greater than 160 and he was found to be a prediabetic Past Medical History  Diagnosis Date  . HTN (hypertension)   . Testicular cancer ([place]) 1990  . Bowel obstruction ([place]) 1990  . Chest pain     a. 03/2014 - Myoview stress - normal nuclear study and normal LV function  . Family history of heart disease   . Chronic headache   . OSA on CPAP   . Bowel obstruction  ([place])     after laparotomy to evaluate pelvic lymph nodes when he had left testicular cancer   Past Surgical History  Procedure Laterality Date  . Bowel surgery  1990    obstruction  . Left heart catheterization with coronary angiogram N/A 11/24/2014    Procedure: LEFT HEART CATHETERIZATION WITH CORONARY ANGIOGRAM;  Surgeon: Belva Crome, MD;  Location: [facility] CATH LAB;  Service: Cardiovascular;  Laterality: N/A;  . Appendectomy     Current Outpatient Prescriptions on File Prior to Visit  Medication Sig Dispense Refill  . amLODipine (NORVASC) 10 MG tablet TAKE 1 TABLET BY MOUTH EVERY DAY 30 tablet 11  . aspirin 81 MG EC tablet TAKE 1 TABLET BY MOUTH EVERY DAY 30 tablet 2  . hydrochlorothiazide (HYDRODIURIL) 25 MG tablet TAKE 1 TABLET BY MOUTH EVERY DAY 30 tablet 2  . losartan (COZAAR) 100 MG tablet Take 1 tablet (100 mg total) by mouth daily. 90 tablet 3   No current facility-administered medications on file prior to visit.   Allergies  Allergen Reactions  . Ace Inhibitors Cough  . Tetracyclines & Related Itching    Swelling in mouth, lips   Social History   Social History  . Marital Status: Married    Spouse Name: N/A  . Number of Children: N/A  . Years of Education: N/A   Occupational History  . Not on file.   Social History Main Topics  .  Smoking status: Never Smoker   . Smokeless tobacco: Not on file  . Alcohol Use: No  . Drug Use: No  . Sexual Activity: Not on file   Other Topics Concern  . 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  Lives in [place] with wife.  Does not routinely exercise.   Family History  Problem Relation Age of Onset  . Diabetes Mother   . Hypertension Mother   . Cancer Mother   . Heart disease Mother     bypass in 37s  . Heart attack Maternal Grandfather     massive MI at age 36  . Hypertension Father   . Stroke Father   . Heart disease Father     bypass in 68s      Review of  Systems  All other systems reviewed and are negative.      Objective:   Physical Exam  Constitutional: He is oriented to person, place, and time. He appears well-developed and well-nourished. No distress.  HENT:  Head: Normocephalic and atraumatic.  Right Ear: External ear normal.  Left Ear: External ear normal.  Nose: Nose normal.  Mouth/Throat: Oropharynx is clear and moist. No oropharyngeal exudate.  Eyes: Conjunctivae and EOM are normal. Pupils are equal, round, and reactive to light. Right eye exhibits no discharge. Left eye exhibits no discharge. No scleral icterus.  Neck: Normal range of motion. Neck supple. No JVD present. No tracheal deviation present. No thyromegaly present.  Cardiovascular: Normal rate, regular rhythm, normal heart sounds and intact distal pulses.  Exam reveals no gallop and no friction rub.   No murmur heard. Pulmonary/Chest: Effort normal and breath sounds normal. No stridor. No respiratory distress. He has no wheezes. He has no rales. He exhibits no tenderness.  Abdominal: Soft. Bowel sounds are normal. He exhibits no distension and no mass. There is no tenderness. There is no rebound and no guarding.  Genitourinary: Rectum normal, prostate normal and penis normal.  Musculoskeletal: Normal range of motion. He exhibits no edema or tenderness.  Lymphadenopathy:    He has no cervical adenopathy.  Neurological: He is alert and oriented to person, place, and time. He has normal reflexes. No cranial nerve deficit. He exhibits normal muscle tone. Coordination normal.  Skin: Skin is warm. No rash noted. He is not diaphoretic. No erythema. No pallor.  Psychiatric: He has a normal mood and affect. His behavior is normal. Judgment and thought content normal.  Vitals reviewed.  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 Ventral abdominal scar.        Assessment & Plan:  Benign essential HTN  HLD (hyperlipidemia)  Prediabetes  Blood pressure is  outstanding. We will make no changes in his dose of blood pressure medication. I would like to see the patient back in October and recheck a fasting lipid panel and hemoglobin A1c. He wants to try 6 months of aggressive lifestyle changes which he has been working on to see if he can bring down his blood sugar and his cholesterol. Reassess in October.

## 2016-03-21 ENCOUNTER — Telehealth: Payer: Self-pay | Admitting: Family Medicine

## 2016-03-21 NOTE — Telephone Encounter (Signed)
LMOVM at office number as requested by pt  [phone]

## 2016-03-21 NOTE — Telephone Encounter (Signed)
Called and spoke to pt and he has been waking up with a severe HA and he started checking his BP in the AM and it is running  158/99,171/101,136/8?Marland Kitchen He states that the HA's are severe and even woke him one morning. He takes all three of his BP meds in the morning together.

## 2016-03-21 NOTE — Telephone Encounter (Signed)
Patients blood pressure has been high in the morning hours and would like a call back regarding this  [phone]

## 2016-03-21 NOTE — Telephone Encounter (Signed)
Although those blood pressures are not good, I doubt they would cause severe headaches. We could certainly add atenolol 50 mg by mouth daily to help with the blood pressures but I believe the patient needs to be seen for the headaches.

## 2016-03-21 NOTE — Telephone Encounter (Signed)
Patient aware of providers recommendations. States that he will hold off on a new med and he has not been using his CPAP and thinks may be that could have something to do with his HA's. Pt elects to try using his CPAP to see if that resolves the HA's and if not he will call back for an appointmet

## 2016-03-29 ENCOUNTER — Other Ambulatory Visit: Payer: Self-pay | Admitting: Family Medicine

## 2016-04-10 NOTE — Telephone Encounter (Signed)
referral faxed to Dr [facility] office per pt request

## 2016-05-02 ENCOUNTER — Other Ambulatory Visit: Payer: Self-pay | Admitting: Family Medicine

## 2016-06-09 ENCOUNTER — Encounter: Payer: Self-pay | Admitting: Family Medicine

## 2016-06-09 ENCOUNTER — Ambulatory Visit (INDEPENDENT_AMBULATORY_CARE_PROVIDER_SITE_OTHER): Payer: 59 | Admitting: Family Medicine

## 2016-06-09 VITALS — BP 110/70 | HR 73 | Temp 98.1°F | Resp 16 | Wt 255.0 lb

## 2016-06-09 DIAGNOSIS — L0232 Furuncle of buttock: Secondary | ICD-10-CM | POA: Diagnosis not present

## 2016-06-09 MED ORDER — SULFAMETHOXAZOLE-TRIMETHOPRIM 800-160 MG PO TABS: 1.0000 | ORAL_TABLET | Freq: Two times a day (BID) | ORAL | 0 refills | Status: DC

## 2016-06-09 NOTE — Patient Instructions (Signed)
Warm compresses  Antibiotics as prescribed F/U as needed

## 2016-06-09 NOTE — Progress Notes (Signed)
   Subjective:    Patient ID: Jimmy Riley, male    DOB: 02/26/1961, 55 y.o.   MRN: [id number]  Patient presents for office visit (cyst on left gluteal )  Pt here with boil on left buttocks for past 3 days. No Drainage has had smaller ones in the past No fever. Uses dial soap Due to leave for vacation fishing trip today     Review Of Systems:  GEN- denies fatigue, fever, weight loss,weakness, recent illness HEENT- denies eye drainage, change in vision, nasal discharge, CVS- denies chest pain, palpitations RESP- denies SOB, cough, wheeze ABD- denies N/V, change in stools, abd pain GU- denies dysuria, hematuria, dribbling, incontinence Neuro- denies headache, dizziness, syncope, seizure activity       Objective:    BP 110/70   Pulse 73   Temp 98.1 F (36.7 C)   Resp 16   Wt 255 lb (115.7 kg)   SpO2 93%   BMI 36.59 kg/m  GEN- NAD, alert and oriented x3 Skin-  Left buttocks nickle size boil, mild TTP, no fluctance, +erythema, indurated at center        Assessment & Plan:      Problem List Items Addressed This Visit    None    Visit Diagnoses    Boil, buttock    -  Primary   non fluctanct as he is leaving for vacation, warm compresses, Bactrim DS , contiue antibacterial soap   Relevant Medications   sulfamethoxazole-trimethoprim (BACTRIM DS,SEPTRA DS) 800-160 MG tablet      Note: This dictation was prepared with Dragon dictation along with smaller phrase technology. Any transcriptional errors that result from this process are unintentional.

## 2016-07-23 ENCOUNTER — Ambulatory Visit (INDEPENDENT_AMBULATORY_CARE_PROVIDER_SITE_OTHER): Payer: 59 | Admitting: Family Medicine

## 2016-07-23 ENCOUNTER — Encounter: Payer: Self-pay | Admitting: Family Medicine

## 2016-07-23 VITALS — BP 118/78 | HR 76 | Temp 98.2°F | Resp 16 | Ht 70.0 in | Wt 250.0 lb

## 2016-07-23 DIAGNOSIS — M940 Chondrocostal junction syndrome [Tietze]: Secondary | ICD-10-CM | POA: Diagnosis not present

## 2016-07-23 DIAGNOSIS — R079 Chest pain, unspecified: Secondary | ICD-10-CM | POA: Diagnosis not present

## 2016-07-23 MED ORDER — PREDNISONE 20 MG PO TABS: ORAL_TABLET | ORAL | 0 refills | Status: DC

## 2016-07-23 NOTE — Progress Notes (Signed)
Subjective:    Patient ID: RACE HIDER, male    DOB: [birth year], 55 y.o.   MRN: [id number]  HPI  Patient has had atypical chest pain for more than a week. The pain began after he was pulling a heavy trash can with more than 100 pounds of weight up a steep incline. He developed a sudden sharp pain in the left chest adjacent to the sternum at the costochondral margin. Pain subsided after a few minutes. It now occurs sporadically throughout the last week. It is unrelated to exertion. It can occur at rest. It is worse with palpation of the chest at the costochondral margin. I'm able to reproduce the pain by pressing on the patient's sternum. He was previously pain-free and after I pressed on his sternum, the pain returned. The pain is improved by ibuprofen. He denies any nausea vomiting. He denies any diaphoresis. He denies any left arm pain. He denies any pleurisy or hemoptysis. EKG today shows normal sinus rhythm with normal axis and normal intervals and no evidence of ischemia or infarction. Patient had a clear catheterization performed in March 2016 Past Medical History:  Diagnosis Date  . Bowel obstruction 1990  . Bowel obstruction    after laparotomy to evaluate pelvic lymph nodes when he had left testicular cancer  . Chest pain    a. 03/2014 - Myoview stress - normal nuclear study and normal LV function  . Chronic headache   . Family history of heart disease   . HTN (hypertension)   . OSA on CPAP   . Testicular cancer ([place]) 1990   Past Surgical History:  Procedure Laterality Date  . APPENDECTOMY    . bowel surgery  1990   obstruction  . LEFT HEART CATHETERIZATION WITH CORONARY ANGIOGRAM N/A 11/24/2014   Procedure: LEFT HEART CATHETERIZATION WITH CORONARY ANGIOGRAM;  Surgeon: Belva Crome, MD;  Location: [facility] CATH LAB;  Service: Cardiovascular;  Laterality: N/A;   Current Outpatient Prescriptions on File Prior to Visit  Medication Sig Dispense Refill  . amLODipine (NORVASC) 10 MG  tablet TAKE 1 TABLET BY MOUTH EVERY DAY 30 tablet 11  . aspirin 81 MG EC tablet TAKE 1 TABLET BY MOUTH EVERY DAY 30 tablet 11  . hydrochlorothiazide (HYDRODIURIL) 25 MG tablet TAKE 1 TABLET BY MOUTH EVERY DAY 30 tablet 2  . losartan (COZAAR) 100 MG tablet Take 1 tablet (100 mg total) by mouth daily. 90 tablet 3   No current facility-administered medications on file prior to visit.    Allergies  Allergen Reactions  . Ace Inhibitors Cough  . Tetracyclines & Related Itching    Swelling in mouth, lips   Social History   Social History  . Marital status: Married    Spouse name: N/A  . Number of children: N/A  . Years of education: N/A   Occupational History  . Not on file.   Social History Main Topics  . Smoking status: Never Smoker  . Smokeless tobacco: Not on file  . Alcohol use No  . Drug use: No  . Sexual activity: Not on file   Other Topics Concern  . 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  Does not routinely exercise.   Family History  Problem Relation Age of Onset  . Diabetes Mother   . Hypertension Mother   . Cancer Mother   . Heart disease Mother  bypass in 75s  . Hypertension Father   . Stroke Father   . Heart disease Father     bypass in 59s  . Heart attack Maternal Grandfather     massive MI at age 23      Review of Systems  Cardiovascular: Positive for chest pain.  All other systems reviewed and are negative.      Objective:   Physical Exam  Constitutional: He is oriented to person, place, and time. He appears well-developed and well-nourished. No distress.  HENT:  Head: Normocephalic and atraumatic.  Right Ear: External ear normal.  Left Ear: External ear normal.  Nose: Nose normal.  Mouth/Throat: Oropharynx is clear and moist. No oropharyngeal exudate.  Eyes: Conjunctivae and EOM are normal. Pupils are equal, round, and reactive to light. Right eye  exhibits no discharge. Left eye exhibits no discharge. No scleral icterus.  Neck: Normal range of motion. Neck supple. No JVD present. No tracheal deviation present. No thyromegaly present.  Cardiovascular: Normal rate, regular rhythm, normal heart sounds and intact distal pulses.  Exam reveals no gallop and no friction rub.   No murmur heard. Pulmonary/Chest: Effort normal and breath sounds normal. No stridor. No respiratory distress. He has no wheezes. He has no rales. He exhibits tenderness.  Abdominal: Soft. Bowel sounds are normal. He exhibits no distension and no mass. There is no tenderness. There is no rebound and no guarding.  Genitourinary: Rectum normal, prostate normal and penis normal.  Musculoskeletal: Normal range of motion. He exhibits no edema or tenderness.  Lymphadenopathy:    He has no cervical adenopathy.  Neurological: He is alert and oriented to person, place, and time. He has normal reflexes. No cranial nerve deficit. He exhibits normal muscle tone. Coordination normal.  Skin: Skin is warm. No rash noted. He is not diaphoretic. No erythema. No pallor.  Psychiatric: He has a normal mood and affect. His behavior is normal. Judgment and thought content normal.  Vitals reviewed.         Assessment & Plan:  Chest pain, unspecified type - Plan: EKG 12-Lead  Costochondritis - Plan: predniSONE (DELTASONE) 20 MG tablet  I believe the patient's chest pain is likely costochondritis given the fact the pain is reproducible at the costochondral margin, he had a normal catheterization performed in March 2016 with clear coronary arteries, and his EKG is unchanged and is normal in appearance. Plus the patient's pain is very atypical. Therefore I'll treat the patient with prednisone taper pack and recheck in one week

## 2016-07-31 ENCOUNTER — Other Ambulatory Visit: Payer: Self-pay | Admitting: Family Medicine

## 2016-08-12 ENCOUNTER — Ambulatory Visit (INDEPENDENT_AMBULATORY_CARE_PROVIDER_SITE_OTHER): Payer: No Typology Code available for payment source | Admitting: Family Medicine

## 2016-08-12 ENCOUNTER — Encounter: Payer: Self-pay | Admitting: Family Medicine

## 2016-08-12 VITALS — BP 110/78 | HR 82 | Temp 98.3°F | Resp 18 | Ht 70.0 in | Wt 257.0 lb

## 2016-08-12 DIAGNOSIS — J019 Acute sinusitis, unspecified: Secondary | ICD-10-CM

## 2016-08-12 MED ORDER — HYDROCOD POLST-CPM POLST ER 10-8 MG/5ML PO SUER: 5.0000 mL | Freq: Two times a day (BID) | ORAL | 0 refills | Status: DC | PRN

## 2016-08-12 MED ORDER — AMOXICILLIN 875 MG PO TABS: 875.0000 mg | ORAL_TABLET | Freq: Two times a day (BID) | ORAL | 0 refills | Status: DC

## 2016-08-12 NOTE — Progress Notes (Signed)
Subjective:    Patient ID: Jimmy Riley, male    DOB: 01-31-1961, 55 y.o.   MRN: [id number]  HPI  Patient's granddaughter has similar symptoms. Patient symptoms began approximately 5 days ago. Symptoms consist of head congestion, rhinorrhea, severe postnasal drip causing a bad sore throat. This is keeping the patient from sleeping. He has not slept well the last 36 hours causing him to feel extremely tired. He reports low-grade subjective fevers. Over the last 24 hours he has developed a nonproductive cough most likely due to the postnasal drip. He also reports pain and pressure in his ears Past Medical History:  Diagnosis Date  . Bowel obstruction 1990  . Bowel obstruction    after laparotomy to evaluate pelvic lymph nodes when he had left testicular cancer  . Chest pain    a. 03/2014 - Myoview stress - normal nuclear study and normal LV function  . Chronic headache   . Family history of heart disease   . HTN (hypertension)   . OSA on CPAP   . Testicular cancer ([place]) 1990   Past Surgical History:  Procedure Laterality Date  . APPENDECTOMY    . bowel surgery  1990   obstruction  . LEFT HEART CATHETERIZATION WITH CORONARY ANGIOGRAM N/A 11/24/2014   Procedure: LEFT HEART CATHETERIZATION WITH CORONARY ANGIOGRAM;  Surgeon: Belva Crome, MD;  Location: [facility] CATH LAB;  Service: Cardiovascular;  Laterality: N/A;   Current Outpatient Prescriptions on File Prior to Visit  Medication Sig Dispense Refill  . amLODipine (NORVASC) 10 MG tablet TAKE 1 TABLET BY MOUTH EVERY DAY 30 tablet 11  . aspirin 81 MG EC tablet TAKE 1 TABLET BY MOUTH EVERY DAY 30 tablet 11  . hydrochlorothiazide (HYDRODIURIL) 25 MG tablet TAKE 1 TABLET BY MOUTH EVERY DAY 30 tablet 2  . losartan (COZAAR) 100 MG tablet Take 1 tablet (100 mg total) by mouth daily. 90 tablet 3   No current facility-administered medications on file prior to visit.    Allergies  Allergen Reactions  . Ace Inhibitors Cough  . Tetracyclines &  Related Itching    Swelling in mouth, lips   Social History   Social History  . Marital status: Married    Spouse name: N/A  . Number of children: N/A  . Years of education: N/A   Occupational History  . Not on file.   Social History Main Topics  . Smoking status: Never Smoker  . Smokeless tobacco: Not on file  . Alcohol use No  . Drug use: No  . Sexual activity: Not on file   Other Topics Concern  . 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  Does not routinely exercise.     Review of Systems  All other systems reviewed and are negative.      Objective:   Physical Exam  Constitutional: He appears well-developed and well-nourished.  HENT:  Right Ear: Tympanic membrane, external ear and ear canal normal.  Left Ear: Tympanic membrane, external ear and ear canal normal.  Nose: Mucosal edema and rhinorrhea present. Right sinus exhibits maxillary sinus tenderness and frontal sinus tenderness. Left sinus exhibits maxillary sinus tenderness and frontal sinus tenderness.  Mouth/Throat: Oropharynx is clear and moist. No oropharyngeal exudate.  Cardiovascular: Normal rate, regular rhythm and normal heart sounds.   Pulmonary/Chest: Effort normal and breath sounds normal. No respiratory distress. He has no wheezes. He  has no rales.  Lymphadenopathy:    He has no cervical adenopathy.  Vitals reviewed.         Assessment & Plan:  Acute rhinosinusitis - Plan: amoxicillin (AMOXIL) 875 MG tablet, chlorpheniramine-HYDROcodone (TUSSIONEX PENNKINETIC ER) 10-8 MG/5ML SUER  I believe the patient has a viral upper respiratory infection/sinusitis. I recommended tincture of time. Because of his high blood pressure he can use Mucinex DM or Coricidin for cough and head congestion. I gave the patient Tussionex, 1 teaspoon every 12 hours as needed for cough and help him rest. I did give him a prescription for  amoxicillin with strict instructions not to fill unless symptoms persist longer than 5 additional days or less she develops a very high fever with worsening sinus pain suggesting a bacterial sinus infection. However at the present time symptoms are consistent with viruses

## 2016-08-15 ENCOUNTER — Telehealth: Payer: Self-pay | Admitting: Family Medicine

## 2016-08-15 DIAGNOSIS — J019 Acute sinusitis, unspecified: Secondary | ICD-10-CM

## 2016-08-15 MED ORDER — HYDROCOD POLST-CPM POLST ER 10-8 MG/5ML PO SUER
5.0000 mL | Freq: Two times a day (BID) | ORAL | 0 refills | Status: DC | PRN
Start: 2016-08-15 — End: 2016-09-09

## 2016-08-15 NOTE — Telephone Encounter (Signed)
ok 

## 2016-08-15 NOTE — Telephone Encounter (Signed)
Pt called and states that he got worse and had to start the antibx however is out of the cough syrup and would like a refill - Ok to refill??

## 2016-08-15 NOTE — Telephone Encounter (Signed)
RX printed, left up front and patient aware to pick up after 2  

## 2016-09-09 ENCOUNTER — Ambulatory Visit (INDEPENDENT_AMBULATORY_CARE_PROVIDER_SITE_OTHER): Payer: No Typology Code available for payment source | Admitting: Family Medicine

## 2016-09-09 ENCOUNTER — Encounter: Payer: Self-pay | Admitting: Family Medicine

## 2016-09-09 VITALS — BP 136/90 | HR 98 | Temp 98.9°F | Resp 16 | Ht 70.0 in | Wt 259.0 lb

## 2016-09-09 DIAGNOSIS — L03317 Cellulitis of buttock: Secondary | ICD-10-CM | POA: Diagnosis not present

## 2016-09-09 MED ORDER — SULFAMETHOXAZOLE-TRIMETHOPRIM 800-160 MG PO TABS: 1.0000 | ORAL_TABLET | Freq: Two times a day (BID) | ORAL | 0 refills | Status: DC

## 2016-09-09 MED ORDER — HYDROCOD POLST-CPM POLST ER 10-8 MG/5ML PO SUER: 5.0000 mL | Freq: Two times a day (BID) | ORAL | 0 refills | Status: DC | PRN

## 2016-09-09 NOTE — Progress Notes (Signed)
Subjective:     Patient ID: Jimmy Riley, male    DOB: [birth year], 56 y.o.   MRN: [id number]  HPI Patient has an erythematous tender nodule on his left gluteus. It appears to be an evolving abscess. It is warm and tender to the touch. He denies any fevers or chills. He continues to have a cough ever since his upper respiratory infection and he was treated for approximately 3 weeks ago. However his lungs are clear on examination today. He denies any rhinorrhea or sinus pain or postnasal drip. He denies any sore throat. Past Medical History:  Diagnosis Date  . Bowel obstruction 1990  . Bowel obstruction    after laparotomy to evaluate pelvic lymph nodes when he had left testicular cancer  . Chest pain    a. 03/2014 - Myoview stress - normal nuclear study and normal LV function  . Chronic headache   . Family history of heart disease   . HTN (hypertension)   . OSA on CPAP   . Testicular cancer ([place]) 1990   Past Surgical History:  Procedure Laterality Date  . APPENDECTOMY    . bowel surgery  1990   obstruction  . LEFT HEART CATHETERIZATION WITH CORONARY ANGIOGRAM N/A 11/24/2014   Procedure: LEFT HEART CATHETERIZATION WITH CORONARY ANGIOGRAM;  Surgeon: Belva Crome, MD;  Location: [facility] CATH LAB;  Service: Cardiovascular;  Laterality: N/A;   Current Outpatient Prescriptions on File Prior to Visit  Medication Sig Dispense Refill  . amLODipine (NORVASC) 10 MG tablet TAKE 1 TABLET BY MOUTH EVERY DAY 30 tablet 11  . aspirin 81 MG EC tablet TAKE 1 TABLET BY MOUTH EVERY DAY 30 tablet 11  . hydrochlorothiazide (HYDRODIURIL) 25 MG tablet TAKE 1 TABLET BY MOUTH EVERY DAY 30 tablet 2  . losartan (COZAAR) 100 MG tablet Take 1 tablet (100 mg total) by mouth daily. 90 tablet 3   No current facility-administered medications on file prior to visit.    Allergies  Allergen Reactions  . Ace Inhibitors Cough  . Tetracyclines & Related Itching    Swelling in mouth, lips   Social History   Social  History  . Marital status: Married    Spouse name: N/A  . Number of children: N/A  . Years of education: N/A   Occupational History  . Not on file.   Social History Main Topics  . Smoking status: Never Smoker  . Smokeless tobacco: Not on file  . Alcohol use No  . Drug use: No  . Sexual activity: Not on file   Other Topics Concern  . 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  Does not routinely exercise.     Review of Systems  All other systems reviewed and are negative.      Objective:   Physical Exam  Constitutional: He appears well-developed and well-nourished.  HENT:  Right Ear: Tympanic membrane and ear canal normal.  Left Ear: Tympanic membrane and ear canal normal.  Cardiovascular: Normal rate, regular rhythm and normal heart sounds.   Pulmonary/Chest: Effort normal and breath sounds normal. No respiratory distress. He has no wheezes. He has no rales.  Vitals reviewed.         Assessment & Plan:  Cellulitis of left buttock - Plan: sulfamethoxazole-trimethoprim (BACTRIM DS,SEPTRA DS) 800-160 MG tablet  Patient has cellulitis of the left gluteus with an evolving abscess. Begin Bactrim double  strength tablets 1 by mouth twice a day for 7 days. Use warm compresses. The area is approximately 1.5 cm in diameter of erythema and induration but there is no fluctuance. May need incision and drainage if no better. I did give him cough medication, Tussionex, 1 teaspoon every 12 hours when necessary cough for I believe his upper airway cough syndrome/persisting cough from his previous upper respiratory infection

## 2016-11-05 ENCOUNTER — Other Ambulatory Visit: Payer: Self-pay | Admitting: Family Medicine

## 2016-12-12 ENCOUNTER — Other Ambulatory Visit: Payer: Self-pay | Admitting: Family Medicine

## 2016-12-12 MED ORDER — HYDROCHLOROTHIAZIDE 25 MG PO TABS: 25.0000 mg | ORAL_TABLET | Freq: Every day | ORAL | 0 refills | Status: DC

## 2017-01-13 ENCOUNTER — Other Ambulatory Visit: Payer: Self-pay | Admitting: Family Medicine

## 2017-01-21 ENCOUNTER — Ambulatory Visit (INDEPENDENT_AMBULATORY_CARE_PROVIDER_SITE_OTHER): Payer: No Typology Code available for payment source | Admitting: Physician Assistant

## 2017-01-21 ENCOUNTER — Encounter: Payer: Self-pay | Admitting: Physician Assistant

## 2017-01-21 VITALS — BP 150/100 | HR 97 | Temp 98.1°F | Resp 16 | Wt 253.2 lb

## 2017-01-21 DIAGNOSIS — J988 Other specified respiratory disorders: Secondary | ICD-10-CM

## 2017-01-21 DIAGNOSIS — B9689 Other specified bacterial agents as the cause of diseases classified elsewhere: Principal | ICD-10-CM

## 2017-01-21 MED ORDER — HYDROCODONE-HOMATROPINE 5-1.5 MG/5ML PO SYRP: 5.0000 mL | ORAL_SOLUTION | Freq: Three times a day (TID) | ORAL | 0 refills | Status: AC | PRN

## 2017-01-21 MED ORDER — AZITHROMYCIN 250 MG PO TABS: ORAL_TABLET | ORAL | 0 refills | Status: AC

## 2017-01-21 NOTE — Progress Notes (Signed)
Patient ID: Jimmy Riley MRN: [id number], DOB: 04/03/1961, 56 y.o. Date of Encounter: 01/21/2017, 4:05 PM    Chief Complaint:  Chief Complaint  Patient presents with  . low grade fever    x3days  . Cough  . nasal drainage     HPI: 56 y.o. year old male presents with above.   Says the symptoms started Saturday 01/17/17. Says his symptoms have "hit him hard, all the sudden" ". Says that Sunday the cough was a lot worse. Says that he has been getting decreased sleep secondary to this chest congestion and cough. Says that one point he checked his temperature was reading 100 but also the last couple of nights has woken up feeling sweaty even though the room was cool. Has not had much sore throat. Has been using Mucinex and Benadryl.     Home Meds:   Outpatient Medications Prior to Visit  Medication Sig Dispense Refill  . amLODipine (NORVASC) 10 MG tablet TAKE 1 TABLET BY MOUTH EVERY DAY 30 tablet 11  . aspirin 81 MG EC tablet TAKE 1 TABLET BY MOUTH EVERY DAY 30 tablet 11  . hydrochlorothiazide (HYDRODIURIL) 25 MG tablet Take 1 tablet (25 mg total) by mouth daily. 90 tablet 0  . losartan (COZAAR) 100 MG tablet TAKE 1 TABLET BY MOUTH EVERY DAY 90 tablet 3  . chlorpheniramine-HYDROcodone (TUSSIONEX PENNKINETIC ER) 10-8 MG/5ML SUER Take 5 mLs by mouth every 12 (twelve) hours as needed for cough. 140 mL 0  . sulfamethoxazole-trimethoprim (BACTRIM DS,SEPTRA DS) 800-160 MG tablet Take 1 tablet by mouth 2 (two) times daily. 14 tablet 0   No facility-administered medications prior to visit.     Allergies:  Allergies  Allergen Reactions  . Ace Inhibitors Cough  . Tetracyclines & Related Itching    Swelling in mouth, lips      Review of Systems: See HPI for pertinent ROS. All other ROS negative.    Physical Exam: Blood pressure (!) 150/100, pulse 97, temperature 98.1 F (36.7 C), temperature source Oral, resp. rate 16, weight 253 lb 3.2 oz (114.9 kg), SpO2 97 %., Body mass  index is 36.33 kg/m. General: [name]. Appears in no acute distress. HEENT: Normocephalic, atraumatic, eyes without discharge, sclera non-icteric, nares are without discharge. Bilateral auditory canals clear, TM's are without perforation, pearly grey and translucent with reflective cone of light bilaterally. Oral cavity moist, posterior pharynx without exudate, erythema. No tenderness with percussion to frontal or maxillary sinuses bilaterally.  Neck: Supple. No thyromegaly. No lymphadenopathy. Lungs: Clear bilaterally to auscultation without wheezes, rales, or rhonchi. Breathing is unlabored. Heart: Regular rhythm. No murmurs, rubs, or gallops. Msk:  Strength and tone normal for age. Extremities/Skin: Warm and dry. Neuro: Alert and oriented X 3. Moves all extremities spontaneously. Gait is normal. CNII-XII grossly in tact. Psych:  Responds to questions appropriately with a normal affect.     ASSESSMENT AND PLAN:  56 y.o. year old male with  1. Bacterial respiratory infection He is to take antibiotic as directed. Can use Hycodan as needed for cough suppressant especially at night prior to going to bed. Follow-up if symptoms do not resolve within 1 week after completion of antibiotic. Discussed with him the blood pressure is reading high today and this could be affected by his illness and the over-the-counter medicines he has been using. He states that his wife is a Marine scientist and checks his blood pressure at home. Told him to make sure to have wife check blood  pressure once this illness is resolved and if reading greater than 140/90 then follow-up. - azithromycin (ZITHROMAX) 250 MG tablet; Day 1: Take 2 daily. Days 2- 5: Take 1 daily.  Dispense: 6 tablet; Refill: 0 - HYDROcodone-homatropine (HYCODAN) 5-1.5 MG/5ML syrup; Take 5 mLs by mouth every 8 (eight) hours as needed for cough.  Dispense: 120 mL; Refill: 0   Signed, [address] [place], [place], [place] 01/21/2017 4:05 PM

## 2017-04-02 ENCOUNTER — Other Ambulatory Visit: Payer: Self-pay | Admitting: Family Medicine

## 2017-05-05 ENCOUNTER — Encounter: Payer: Self-pay | Admitting: Family Medicine

## 2017-07-03 ENCOUNTER — Other Ambulatory Visit: Payer: Self-pay | Admitting: Family Medicine

## 2017-07-03 MED ORDER — HYDROCHLOROTHIAZIDE 25 MG PO TABS: 25.0000 mg | ORAL_TABLET | Freq: Every day | ORAL | 0 refills | Status: DC

## 2017-07-03 MED ORDER — AMLODIPINE BESYLATE 10 MG PO TABS: 10.0000 mg | ORAL_TABLET | Freq: Every day | ORAL | 0 refills | Status: DC

## 2017-08-07 ENCOUNTER — Encounter: Payer: Self-pay | Admitting: Family Medicine

## 2017-11-28 ENCOUNTER — Other Ambulatory Visit: Payer: Self-pay | Admitting: Family Medicine

## 2018-01-22 ENCOUNTER — Other Ambulatory Visit: Payer: Self-pay | Admitting: Family Medicine

## 2018-03-16 ENCOUNTER — Other Ambulatory Visit: Payer: Self-pay | Admitting: Family Medicine

## 2018-04-17 ENCOUNTER — Other Ambulatory Visit: Payer: Self-pay | Admitting: Family Medicine
